# Patient Record
Sex: Female | Born: 1985 | Race: Black or African American | Hispanic: No | Marital: Single | Smoking: Never smoker
Health system: Southern US, Community
[De-identification: ages and names within clinical notes are randomized; demographics above are authoritative.]

## PROBLEM LIST (undated history)

## (undated) ENCOUNTER — Inpatient Hospital Stay (HOSPITAL_COMMUNITY): Payer: Self-pay

## (undated) DIAGNOSIS — O24419 Gestational diabetes mellitus in pregnancy, unspecified control: Secondary | ICD-10-CM

## (undated) DIAGNOSIS — R519 Headache, unspecified: Secondary | ICD-10-CM

## (undated) DIAGNOSIS — R51 Headache: Secondary | ICD-10-CM

## (undated) DIAGNOSIS — D649 Anemia, unspecified: Secondary | ICD-10-CM

## (undated) HISTORY — DX: Headache, unspecified: R51.9

## (undated) HISTORY — PX: WISDOM TOOTH EXTRACTION: SHX21

## (undated) HISTORY — PX: TONSILLECTOMY AND ADENOIDECTOMY: SHX28

## (undated) HISTORY — DX: Anemia, unspecified: D64.9

## (undated) HISTORY — DX: Headache: R51

## (undated) HISTORY — DX: Gestational diabetes mellitus in pregnancy, unspecified control: O24.419

---

## 2009-01-30 ENCOUNTER — Ambulatory Visit: Payer: Self-pay | Admitting: Gynecology

## 2009-02-11 ENCOUNTER — Emergency Department (HOSPITAL_COMMUNITY): Admission: EM | Admit: 2009-02-11 | Discharge: 2009-02-11 | Payer: Self-pay | Admitting: Emergency Medicine

## 2009-02-21 ENCOUNTER — Ambulatory Visit: Payer: Self-pay | Admitting: Women's Health

## 2009-12-24 ENCOUNTER — Ambulatory Visit: Payer: Self-pay | Admitting: Gynecology

## 2010-02-06 ENCOUNTER — Ambulatory Visit: Payer: Self-pay | Admitting: Gynecology

## 2010-02-06 ENCOUNTER — Other Ambulatory Visit: Admission: RE | Admit: 2010-02-06 | Discharge: 2010-02-06 | Payer: Self-pay | Admitting: Gynecology

## 2010-03-31 ENCOUNTER — Ambulatory Visit: Payer: Self-pay | Admitting: Women's Health

## 2010-04-18 ENCOUNTER — Ambulatory Visit: Payer: Self-pay | Admitting: Gynecology

## 2011-05-23 ENCOUNTER — Emergency Department (HOSPITAL_COMMUNITY)
Admission: EM | Admit: 2011-05-23 | Discharge: 2011-05-23 | Disposition: A | Discharge status: Home / Self Care | Payer: Managed Care, Other (non HMO) | Source: Home / Self Care | Attending: Family Medicine | Admitting: Family Medicine

## 2011-05-23 ENCOUNTER — Encounter: Payer: Self-pay | Admitting: Emergency Medicine

## 2011-05-23 ENCOUNTER — Emergency Department (INDEPENDENT_AMBULATORY_CARE_PROVIDER_SITE_OTHER): Payer: Managed Care, Other (non HMO)

## 2011-05-23 DIAGNOSIS — IMO0002 Reserved for concepts with insufficient information to code with codable children: Secondary | ICD-10-CM

## 2011-05-23 DIAGNOSIS — S39012A Strain of muscle, fascia and tendon of lower back, initial encounter: Secondary | ICD-10-CM

## 2011-05-23 MED ORDER — CYCLOBENZAPRINE HCL 5 MG PO TABS: 5.0000 mg | ORAL_TABLET | Freq: Three times a day (TID) | ORAL | Status: AC | PRN

## 2011-05-23 MED ORDER — HYDROCODONE-ACETAMINOPHEN 5-325 MG PO TABS: ORAL_TABLET | ORAL | Status: AC

## 2011-05-23 MED ORDER — NAPROXEN 500 MG PO TABS: 500.0000 mg | ORAL_TABLET | Freq: Two times a day (BID) | ORAL | Status: AC

## 2011-05-23 NOTE — ED Notes (Signed)
Pt here with sudden lower back pain that started 05/14/11 while bending over.pt states it felt like something tore in my lower back the radiated to right thigh with intermit numbness.pt went to [facility] and was told to take otc ibuprofen but states sx has worsened with burning pain and the whole back is hurting.no injury reported.

## 2011-05-23 NOTE — ED Provider Notes (Signed)
History     CSN: [id number]  Arrival date & time 05/23/11  1153   First MD Initiated Contact with Patient 05/23/11 1157      Chief Complaint  Patient presents with  . Back Pain    (Consider location/radiation/quality/duration/timing/severity/associated sxs/prior treatment) HPI Comments: Brandy Newton presents for evaluation of acute back pain that started on 12/27, after bending over to pick up a box. She felt immediate pain radiating across her back, with some radiation down her RIGHT leg.  Patient is a 26 y.o. female presenting with back pain. The history is provided by the patient.  Back Pain  This is a new problem. The current episode started more than 1 week ago. The problem occurs constantly. The problem has not changed since onset.The pain is associated with lifting heavy objects. The pain is present in the lumbar spine. The quality of the pain is described as stabbing and shooting. The pain radiates to the right thigh. The pain is moderate. The symptoms are aggravated by bending, twisting and certain positions. Pertinent negatives include no paresthesias, no paresis, no tingling and no weakness.    Past Medical History  Diagnosis Date  . Asthma     History reviewed. No pertinent past surgical history.  No family history on file.  History  Substance Use Topics  . Smoking status: Never Smoker   . Smokeless tobacco: Not on file  . Alcohol Use: Yes    OB History    Grav Para Term Preterm Abortions TAB SAB Ect Mult Living                  Review of Systems  Musculoskeletal: Positive for back pain.  Neurological: Negative for tingling, weakness and paresthesias.    Allergies  Review of patient's allergies indicates no known allergies.  Home Medications   Current Outpatient Rx  Name Route Sig Dispense Refill  . CYCLOBENZAPRINE HCL 5 MG PO TABS Oral Take 1 tablet (5 mg total) by mouth 3 (three) times daily as needed for muscle spasms (Take one tablet at night, and up  to three times daily if not too drowsy; may take two tablets at a time if tolerated). 15 tablet 0  . HYDROCODONE-ACETAMINOPHEN 5-325 MG PO TABS  Take one to two tablets every 4 to 6 hours as needed for pain 20 tablet 0  . NAPROXEN 500 MG PO TABS Oral Take 1 tablet (500 mg total) by mouth 2 (two) times daily. 30 tablet 0    BP 130/76  Pulse 80  Temp(Src) 98.2 F (36.8 C) (Oral)  Resp 20  SpO2 100%  LMP 05/23/2011  Physical Exam  Nursing note and vitals reviewed. Constitutional: She is oriented to person, place, and time. She appears well-developed and well-nourished.  HENT:  Head: Normocephalic and atraumatic.  Eyes: EOM are normal.  Neck: Normal range of motion.  Pulmonary/Chest: Effort normal.  Musculoskeletal: Normal range of motion.       Lumbar back: She exhibits tenderness, bony tenderness and pain.       Back:  Neurological: She is alert and oriented to person, place, and time.  Skin: Skin is warm and dry.  Psychiatric: Her behavior is normal.    ED Course  Procedures (including critical care time)  Labs Reviewed - No data to display Dg Lumbar Spine Complete  05/23/2011  *RADIOLOGY REPORT*  Clinical Data: Low back pain.  LUMBAR SPINE - COMPLETE 4+ VIEW  Comparison: None.  Findings: Five non-rib bearing lumbar vertebrae.  These  have normal appearances.  No pars defects or subluxations.  IMPRESSION: Normal examination.  Original Report Authenticated By: Darrol Angel, M.D.     1. Back strain       MDM  Back strain        Richardo Priest, MD 05/23/11 [phone]

## 2013-11-14 ENCOUNTER — Encounter: Payer: Self-pay | Admitting: Women's Health

## 2013-11-14 ENCOUNTER — Ambulatory Visit (INDEPENDENT_AMBULATORY_CARE_PROVIDER_SITE_OTHER): Payer: BC Managed Care – PPO | Admitting: Women's Health

## 2013-11-14 VITALS — BP 112/70 | Ht 64.5 in | Wt 179.0 lb

## 2013-11-14 DIAGNOSIS — J45990 Exercise induced bronchospasm: Secondary | ICD-10-CM

## 2013-11-14 DIAGNOSIS — Z01419 Encounter for gynecological examination (general) (routine) without abnormal findings: Secondary | ICD-10-CM

## 2013-11-14 DIAGNOSIS — Z833 Family history of diabetes mellitus: Secondary | ICD-10-CM

## 2013-11-14 DIAGNOSIS — Z3041 Encounter for surveillance of contraceptive pills: Secondary | ICD-10-CM

## 2013-11-14 LAB — CBC WITH DIFFERENTIAL/PLATELET
BASOS PCT: 0 % (ref 0–1)
Basophils Absolute: 0 10*3/uL (ref 0.0–0.1)
EOS ABS: 0.1 10*3/uL (ref 0.0–0.7)
Eosinophils Relative: 1 % (ref 0–5)
HCT: 40.9 % (ref 36.0–46.0)
HEMOGLOBIN: 13.5 g/dL (ref 12.0–15.0)
LYMPHS ABS: 3.3 10*3/uL (ref 0.7–4.0)
Lymphocytes Relative: 51 % — ABNORMAL HIGH (ref 12–46)
MCH: 28.7 pg (ref 26.0–34.0)
MCHC: 33 g/dL (ref 30.0–36.0)
MCV: 86.8 fL (ref 78.0–100.0)
MONOS PCT: 8 % (ref 3–12)
Monocytes Absolute: 0.5 10*3/uL (ref 0.1–1.0)
NEUTROS ABS: 2.6 10*3/uL (ref 1.7–7.7)
NEUTROS PCT: 40 % — AB (ref 43–77)
PLATELETS: 388 10*3/uL (ref 150–400)
RBC: 4.71 MIL/uL (ref 3.87–5.11)
RDW: 13.9 % (ref 11.5–15.5)
WBC: 6.4 10*3/uL (ref 4.0–10.5)

## 2013-11-14 LAB — GLUCOSE, RANDOM: GLUCOSE: 86 mg/dL (ref 70–99)

## 2013-11-14 LAB — TSH: TSH: 1.313 u[IU]/mL (ref 0.350–4.500)

## 2013-11-14 MED ORDER — ALBUTEROL SULFATE HFA 108 (90 BASE) MCG/ACT IN AERS: 2.0000 | INHALATION_SPRAY | Freq: Four times a day (QID) | RESPIRATORY_TRACT | Status: DC | PRN

## 2013-11-14 MED ORDER — LEVONORGESTREL-ETHINYL ESTRAD 0.1-20 MG-MCG PO TABS: 1.0000 | ORAL_TABLET | Freq: Every day | ORAL | Status: DC

## 2013-11-14 MED ORDER — FLUCONAZOLE 150 MG PO TABS: 150.0000 mg | ORAL_TABLET | Freq: Once | ORAL | Status: DC

## 2013-11-14 NOTE — Progress Notes (Signed)
Brandy Newton Genet Oct 19, 1985 [id number]    History:    Presents for annual exam.  Regular monthly cycle on lo Loestrin, same partner for for 4 years. Requesting birth control pill change, cost and skin changes.  Normal Pap history. Did not receive gardasil. Had normal Pap 2014.  Past medical history, past surgical history, family history and social history were all reviewed and documented in the EPIC chart. Graduated from [employer] in [employer], [place]. Uses an albuterol inhaler for exercise-induced asthma.  ROS:  A  12 point ROS was performed and pertinent positives and negatives are included.  Exam:  Filed Vitals:   11/14/13 0923  BP: 112/70    General appearance:  Normal Thyroid:  Symmetrical, normal in size, without palpable masses or nodularity. Respiratory  Auscultation:  Clear without wheezing or rhonchi Cardiovascular  Auscultation:  Regular rate, without rubs, murmurs or gallops  Edema/varicosities:  Not grossly evident Abdominal  Soft,nontender, without masses, guarding or rebound.  Liver/spleen:  No organomegaly noted  Hernia:  None appreciated  Skin  Inspection:  Grossly normal   Breasts: Examined lying and sitting.     Right: Without masses, retractions, discharge or axillary adenopathy.     Left: Without masses, retractions, discharge or axillary adenopathy. Gentitourinary   Inguinal/mons:  Normal without inguinal adenopathy  External genitalia:  Declined pelvic exam today, on cycle.  Assessment/Plan:  28 y.o. SBF G0 for annual exam.    Contraception management Headaches without aura Exercise-induced asthma  Plan: Albuterol inhaler, uses only occasionally with exercise, prescription, proper use reviewed, if increased problems reviewed would need to see internal medicine/primary care. Contraception options reviewed, will try Alesse, prescription, proper use, slight risk for blood clots and strokes reviewed. Finish out current pack and start. SBE's,  regular exercise, calcium rich diet, MVI daily encouraged. CBC, TSH, glucose, UA. Instructed to return to office in several weeks for a pelvic exam. Instructed to schedule appointment with Dr. Vela ProseLewitt to evaluate headaches.  Note: This dictation was prepared with Dragon/digital dictation.  Any transcriptional errors that result are unintentional. Harrington ChallengerYOUNG,NANCY J [facility] HospitalWHNP, 12:22 PM 11/14/2013

## 2013-11-14 NOTE — Patient Instructions (Signed)

## 2013-11-15 LAB — URINALYSIS W MICROSCOPIC + REFLEX CULTURE
Bacteria, UA: NONE SEEN
Bilirubin Urine: NEGATIVE
Casts: NONE SEEN
Crystals: NONE SEEN
GLUCOSE, UA: NEGATIVE mg/dL
HGB URINE DIPSTICK: NEGATIVE
Ketones, ur: NEGATIVE mg/dL
LEUKOCYTES UA: NEGATIVE
NITRITE: NEGATIVE
PH: 5.5 (ref 5.0–8.0)
PROTEIN: NEGATIVE mg/dL
SQUAMOUS EPITHELIAL / LPF: NONE SEEN
Specific Gravity, Urine: 1.008 (ref 1.005–1.030)
Urobilinogen, UA: 0.2 mg/dL (ref 0.0–1.0)

## 2014-02-01 ENCOUNTER — Encounter: Payer: Self-pay | Admitting: Women's Health

## 2014-02-01 ENCOUNTER — Ambulatory Visit (INDEPENDENT_AMBULATORY_CARE_PROVIDER_SITE_OTHER): Payer: BC Managed Care – PPO | Admitting: Women's Health

## 2014-02-01 DIAGNOSIS — Z01419 Encounter for gynecological examination (general) (routine) without abnormal findings: Secondary | ICD-10-CM

## 2014-02-01 DIAGNOSIS — N760 Acute vaginitis: Secondary | ICD-10-CM

## 2014-02-01 DIAGNOSIS — A499 Bacterial infection, unspecified: Secondary | ICD-10-CM

## 2014-02-01 DIAGNOSIS — B9689 Other specified bacterial agents as the cause of diseases classified elsewhere: Secondary | ICD-10-CM

## 2014-02-01 DIAGNOSIS — N898 Other specified noninflammatory disorders of vagina: Secondary | ICD-10-CM

## 2014-02-01 DIAGNOSIS — Z113 Encounter for screening for infections with a predominantly sexual mode of transmission: Secondary | ICD-10-CM

## 2014-02-01 LAB — WET PREP FOR TRICH, YEAST, CLUE
TRICH WET PREP: NONE SEEN
WBC WET PREP: NONE SEEN
Yeast Wet Prep HPF POC: NONE SEEN

## 2014-02-01 MED ORDER — METRONIDAZOLE 0.75 % VA GEL: VAGINAL | Status: DC

## 2014-02-01 MED ORDER — FLUCONAZOLE 150 MG PO TABS: 150.0000 mg | ORAL_TABLET | Freq: Once | ORAL | Status: DC

## 2014-02-01 NOTE — Patient Instructions (Signed)
Bacterial Vaginosis Bacterial vaginosis is an infection of the vagina. It happens when too many of certain germs (bacteria) grow in the vagina. HOME CARE  Take your medicine as told by your doctor.  Finish your medicine even if you start to feel better.  Do not have sex until you finish your medicine and are better.  Tell your sex partner that you have an infection. They should see their doctor for treatment.  Practice safe sex. Use condoms. Have only one sex partner. GET HELP IF:  You are not getting better after 3 days of treatment.  You have more grey fluid (discharge) coming from your vagina than before.  You have more pain than before.  You have a fever. MAKE SURE YOU:   Understand these instructions.  Will watch your condition.  Will get help right away if you are not doing well or get worse. Document Released: 02/11/2008 Document Revised: 02/22/2013 Document Reviewed: 12/14/2012 ExitCare Patient Information 2015 [employer]. This information is not intended to replace advice given to you by your health care provider. Make sure you discuss any questions you have with your health care provider.  

## 2014-02-01 NOTE — Progress Notes (Signed)
Patient ID: Brandy Newton, female   DOB: 1985/07/15, 28 y.o.   MRN: [id number] Presents with complaint of vaginal discharge with odor and mild itching. Recurrent bacteria vaginosis. Recently found out long-term partner unfaithful, no longer together. Denies urinary symptoms, abdominal pain or fever.  Exam: Appears well. External genitalia within normal limits, speculum exam moderate amount of a white adherent discharge noted wet prep positive for amines, clues, TNTC bacteria. GC/Chlamydia culture taken. Bimanual no CMT or adnexal fullness or tenderness.  Bacteria vaginosis STD screen  Plan: MetroGel vaginal cream 1 applicator at bedtime x5, Diflucan 150 by mouth x1 dose if vaginal itching does not resolve. GC/Chlamydia culture pending, HIV, hep B, C., RPR. Encouraged condoms if sexually active. Boric Acid gel caps 600 mg per vagina twice weekly after symptoms are resolved.

## 2014-02-02 LAB — GC/CHLAMYDIA PROBE AMP
CT Probe RNA: NEGATIVE
GC Probe RNA: NEGATIVE

## 2014-02-13 ENCOUNTER — Ambulatory Visit (INDEPENDENT_AMBULATORY_CARE_PROVIDER_SITE_OTHER): Payer: BC Managed Care – PPO | Admitting: Family Medicine

## 2014-02-13 VITALS — BP 122/82 | HR 78 | Temp 98.1°F | Resp 18 | Ht 66.0 in | Wt 171.6 lb

## 2014-02-13 DIAGNOSIS — G43111 Migraine with aura, intractable, with status migrainosus: Secondary | ICD-10-CM

## 2014-02-13 MED ORDER — OXYCODONE-ACETAMINOPHEN 7.5-325 MG PO TABS: 1.0000 | ORAL_TABLET | ORAL | Status: DC | PRN

## 2014-02-13 NOTE — Progress Notes (Signed)
This is a 28 year old woman who [place] for [place]. 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  Has long history of migraines and usually is able take Excedrin Migraine for good relief. Nevertheless, beginning on Saturday she developed a headache which just did not want go away. He describes the headache as pounding and frontal. He's had some nausea but no vomiting. She's had some scotoma as well.  Patient denies neck stiffness, fever, sore throat, ear pain. Patient has had no focal weakness or numbness. He has had some significant dizziness however.  She was like to see a neurologist.  Objective: No acute distress Neck is supple with no adenopathy or thyromegaly HEENT: Unremarkable with equal pupils. Respirations are normal Neurological: Moving 4 extremities equally. Patient is stable on her feet. Cranial nerves III through XII are intact  Assessment: Status migrainous  Intractable migraine with aura with status migrainosus - Plan: oxyCODONE-acetaminophen (PERCOCET) 7.5-325 MG per tablet, Ambulatory referral to Neurology  Signed, Elvina SidleKurt Rut Betterton, MD

## 2014-02-13 NOTE — Patient Instructions (Signed)

## 2014-03-12 ENCOUNTER — Telehealth: Payer: Self-pay | Admitting: *Deleted

## 2014-03-12 NOTE — Telephone Encounter (Signed)
(   pt aware you are out of the office) Pt called requesting Rx for recurrent yeast infection, pt aware OV best, states she has spoken to you before about recurrent yeast. Please advise

## 2014-03-12 NOTE — Telephone Encounter (Signed)
OV best, diflucan 150 on dose if no relief OV.

## 2014-03-13 MED ORDER — FLUCONAZOLE 150 MG PO TABS: 150.0000 mg | ORAL_TABLET | Freq: Once | ORAL | Status: DC

## 2014-03-13 NOTE — Telephone Encounter (Signed)
Pt informed with the below note, rx sent. 

## 2014-03-23 ENCOUNTER — Encounter: Payer: Self-pay | Admitting: Neurology

## 2014-03-23 ENCOUNTER — Ambulatory Visit (INDEPENDENT_AMBULATORY_CARE_PROVIDER_SITE_OTHER): Payer: BC Managed Care – PPO | Admitting: Neurology

## 2014-03-23 VITALS — BP 122/78 | HR 80 | Temp 98.6°F | Resp 16 | Ht 64.0 in | Wt 173.8 lb

## 2014-03-23 DIAGNOSIS — G43119 Migraine with aura, intractable, without status migrainosus: Secondary | ICD-10-CM

## 2014-03-23 DIAGNOSIS — H539 Unspecified visual disturbance: Secondary | ICD-10-CM

## 2014-03-23 DIAGNOSIS — IMO0002 Reserved for concepts with insufficient information to code with codable children: Secondary | ICD-10-CM

## 2014-03-23 DIAGNOSIS — R202 Paresthesia of skin: Secondary | ICD-10-CM

## 2014-03-23 DIAGNOSIS — G43709 Chronic migraine without aura, not intractable, without status migrainosus: Secondary | ICD-10-CM

## 2014-03-23 DIAGNOSIS — Z820 Family history of epilepsy and other diseases of the nervous system: Secondary | ICD-10-CM

## 2014-03-23 MED ORDER — TOPIRAMATE 25 MG PO TABS: 25.0000 mg | ORAL_TABLET | Freq: Every day | ORAL | Status: DC

## 2014-03-23 MED ORDER — SUMATRIPTAN SUCCINATE 100 MG PO TABS: ORAL_TABLET | ORAL | Status: DC

## 2014-03-23 NOTE — Patient Instructions (Addendum)
1.  Take sumatriptan 100mg  at earliest onset of migraine.  May repeat once in 2 hours if headache recurs or persists.  Do not exceed 2 tablets in 24 hours. 2.  Take topiramate 25mg  daily at bedtime.  Possible side effects include: impaired thinking, sedation, paresthesias (numbness and tingling) and weight loss.  It may cause dehydration and there is a small risk for kidney stones, so make sure to stay hydrated with water during the day.  There is also a very small risk for glaucoma, so if you notice any change in your vision while taking this medication, see an ophthalmologist.  There is also a very small risk of possible suicidal ideation, as it the case with all antiepileptic medications.e  3.  Limit use of pain relievers (including sumatriptan) to no more than 2 days out of the week to prevent rebound headache 4.  Will get MRI of the brain with and without contrast [facility]  11/23/15at 4:45pm 5.  We will refer you to psychiatry to address the nightmares because proper sleep is important to treat headaches. 6.  Follow up in 3 months but call in 4 weeks with update.  Keep a headache diary listing the number of migraines and other headaches per day to see how you are doing

## 2014-03-23 NOTE — Progress Notes (Signed)
NEUROLOGY CONSULTATION NOTE  Brandy Hawiesha Schalk MRN: [id number] DOB: 07-10-1985  Referring provider: Dr. Milus GlazierLauenstein Primary care provider: NA  Reason for consult:  Headache  HISTORY OF PRESENT ILLNESS: Brandy Newton is a 28 year old right-handed woman with history of asthma and mgraine who presents for headache.  Onset:  28 years old Location:  Unilateral, either side, but can spread across forehead Quality:  Throbbing and squeezing Intensity:  6-9/10  Aura:  Scotomas and blurred vision in either eye for 10 minutes Prodrome:  no Associated symptoms:  Dizziness, photophobia, phonophobia Duration:  2-4 hours.   Frequency:  2-3 times a week (8-12 migraine days per month).  However, she also has nagging headache, so total of 18 headache days per month. Triggers/exacerbating factors:  Stress, NovaRing Relieving factors:  Dark quiet room Activity:  Cannot function about 1-2 days per month.  Past abortive therapy:  Excedrin and Excedrin Migraine (initially helpful), Advil, Aleve, Tylenol Past preventative therapy:  Does not remember  Current abortive therapy:  Percocet (took only 4 in past month).  BC powder rarely Current preventative therapy:  none  Caffeine:  Coffee twice a week.  No soda or tea Alcohol:  no Smoker:  no Diet:  Healthy.  Lost 10 lbs.  Drinks plenty of water Exercise:  regularly Depression/stress:  Some stress related to work.  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  She has frequent nightmares that cause stress and prevents her from falling back to sleep. Family history of headache:  No Family neurologic history:  Mom has MS.  No family history of intracranial aneurysms.  About 4 weeks ago, she had an episode of migraine that was accompanied by new symptoms.  She noted numbness and tingling in her hands that lasted all day.  PAST MEDICAL HISTORY: Past Medical History  Diagnosis Date   . Asthma   . Asthma   . Anemia   . Headache     PAST SURGICAL HISTORY: Past Surgical History  Procedure Laterality Date  . Tonsillectomy and adenoidectomy      MEDICATIONS: Current Outpatient Prescriptions on File Prior to Visit  Medication Sig Dispense Refill  . albuterol (PROVENTIL HFA;VENTOLIN HFA) 108 (90 BASE) MCG/ACT inhaler Inhale 2 puffs into the lungs every 6 (six) hours as needed for wheezing or shortness of breath. 1 Inhaler 2  . fluconazole (DIFLUCAN) 150 MG tablet Take 1 tablet (150 mg total) by mouth once. 1 tablet 0  . metroNIDAZOLE (METROGEL VAGINAL) 0.75 % vaginal gel 1 applicator per vagina at HS x 5 70 g 1  . oxyCODONE-acetaminophen (PERCOCET) 7.5-325 MG per tablet Take 1 tablet by mouth every 4 (four) hours as needed for pain. 20 tablet 0   No current facility-administered medications on file prior to visit.    ALLERGIES: No Known Allergies  FAMILY HISTORY: Family History  Problem Relation Age of Onset  . Hypertension Father   . Hypertension Paternal Grandmother   . Diabetes Paternal Grandmother   . Hypertension Paternal Grandfather   . Diabetes Paternal Grandfather   . Diabetes Maternal Grandmother   . Diabetes Maternal Grandfather   . Multiple sclerosis Mother     SOCIAL HISTORY: History   Social History  . Marital Status: Single    Spouse Name: N/A    Number of Children: N/A  . Years of Education: N/A   Occupational History  . Not on file.   Social  History Main Topics  . Smoking status: Never Smoker   . Smokeless tobacco: Not on file  . Alcohol Use: No     Comment: SOCIAL  . Drug Use: No  . Sexual Activity:    Partners: Male    Birth Control/ Protection: Condom   Other Topics Concern  . Not on file   Social History Narrative    REVIEW OF SYSTEMS: Constitutional: No fevers, chills, or sweats, no generalized fatigue, change in appetite Eyes: No visual changes, double vision, eye pain Ear, nose and throat: No hearing loss,  ear pain, nasal congestion, sore throat Cardiovascular: No chest pain, palpitations Respiratory:  No shortness of breath at rest or with exertion, wheezes GastrointestinaI: No nausea, vomiting, diarrhea, abdominal pain, fecal incontinence Genitourinary:  No dysuria, urinary retention or frequency Musculoskeletal:  No neck pain, back pain Integumentary: No rash, pruritus, skin lesions Neurological: as above Psychiatric: No depression, insomnia, anxiety Endocrine: No palpitations, fatigue, diaphoresis, mood swings, change in appetite, change in weight, increased thirst Hematologic/Lymphatic:  No anemia, purpura, petechiae. Allergic/Immunologic: no itchy/runny eyes, nasal congestion, recent allergic reactions, rashes  PHYSICAL EXAM: Filed Vitals:   03/23/14 0802  BP: 122/78  Pulse: 80  Temp: 98.6 F (37 C)  Resp: 16   General: No acute distress Head:  Normocephalic/atraumatic Neck: supple, no paraspinal tenderness, full range of motion Back: No paraspinal tenderness Heart: regular rate and rhythm Lungs: Clear to auscultation bilaterally. Vascular: No carotid bruits. Neurological Exam: Mental status: alert and oriented to person, place, and time, recent and remote memory intact, fund of knowledge intact, attention and concentration intact, speech fluent and not dysarthric, language intact. Cranial nerves: CN I: not tested CN II: pupils equal, round and reactive to light, visual fields intact, fundi unremarkable, without vessel changes, exudates, hemorrhages or papilledema. CN III, IV, VI:  full range of motion, no nystagmus, no ptosis CN V: facial sensation intact CN VII: upper and lower face symmetric CN VIII: hearing intact CN IX, X: gag intact, uvula midline CN XI: sternocleidomastoid and trapezius muscles intact CN XII: tongue midline Bulk & Tone: normal, no fasciculations. Motor:  5/5 throughout Sensation: temperature and vibration intact Deep Tendon Reflexes: 2+  throughout, toes downgoing Finger to nose testing:  No dysmetria Heel to shin: no dysmetria Gait:  Normal station and stride.  Able to turn and walk in tandem. Romberg negative.  IMPRESSION: Chronic migraine with aura Visual disturbance Episode of numbness and tingling involving the hands Family history of MS  PLAN: 1.  Topamax 25mg  daily for preventative therapy.  Discussed side effects. 2.  Sumatriptan 100mg  for abortive therapy.  Stop Percocet. 3.  Limit use of pain relievers to no more than 2 days out of the week to prevent rebound headache 4.  Episode of numbness and tingling and visual disturbance likely migraine.  But since numbness and tingling is a new symptoms, and she has a family history of MS, will get MRI of the brain with and without  Contrast. 5.  Sleep hygiene is very important to treat headaches.  We will refer her to psychiatry to address frequent nightmares. 6.  Headache diary 7.  Follow up in 3 months.  Call in 4 weeks with update. Thank you for allowing me to take part in the care of this patient.  Shon MilletAdam Jaffe, DO  CC:  Elvina SidleKurt Lauenstein, MD

## 2014-04-05 ENCOUNTER — Telehealth: Payer: Self-pay | Admitting: Neurology

## 2014-04-05 NOTE — Telephone Encounter (Signed)
See below note this patient called [facility] and cancelled MRI Brain does not want to have it at all . I tried to contact patient however it appears her telephone has been disconnected .

## 2014-04-05 NOTE — Telephone Encounter (Signed)
Tasha from [facility] called wanting to speak to DR. Jaffe's nurse. Pt canceled their MRI appt and she would like to know what she needs to do with the MRI order. Please call her to confirm. C/b [phone]

## 2014-04-06 ENCOUNTER — Ambulatory Visit (INDEPENDENT_AMBULATORY_CARE_PROVIDER_SITE_OTHER): Payer: BC Managed Care – PPO | Admitting: Women's Health

## 2014-04-06 ENCOUNTER — Encounter: Payer: Self-pay | Admitting: Women's Health

## 2014-04-06 VITALS — BP 122/80 | Ht 64.0 in | Wt 171.0 lb

## 2014-04-06 DIAGNOSIS — A499 Bacterial infection, unspecified: Secondary | ICD-10-CM

## 2014-04-06 DIAGNOSIS — N898 Other specified noninflammatory disorders of vagina: Secondary | ICD-10-CM

## 2014-04-06 DIAGNOSIS — N760 Acute vaginitis: Secondary | ICD-10-CM

## 2014-04-06 DIAGNOSIS — B9689 Other specified bacterial agents as the cause of diseases classified elsewhere: Secondary | ICD-10-CM

## 2014-04-06 LAB — URINALYSIS W MICROSCOPIC + REFLEX CULTURE
BILIRUBIN URINE: NEGATIVE
GLUCOSE, UA: NEGATIVE mg/dL
HGB URINE DIPSTICK: NEGATIVE
KETONES UR: NEGATIVE mg/dL
LEUKOCYTES UA: NEGATIVE
Nitrite: NEGATIVE
PH: 8.5 — AB (ref 5.0–8.0)
PROTEIN: NEGATIVE mg/dL
Specific Gravity, Urine: 1.015 (ref 1.005–1.030)
Urobilinogen, UA: 0.2 mg/dL (ref 0.0–1.0)

## 2014-04-06 LAB — WET PREP FOR TRICH, YEAST, CLUE
Trich, Wet Prep: NONE SEEN
WBC WET PREP: NONE SEEN
Yeast Wet Prep HPF POC: NONE SEEN

## 2014-04-06 MED ORDER — METRONIDAZOLE 500 MG PO TABS: 500.0000 mg | ORAL_TABLET | Freq: Two times a day (BID) | ORAL | Status: DC

## 2014-04-06 NOTE — Progress Notes (Signed)
Patient ID: Brandy Newton, female   DOB: Aug 14, 1985, 28 y.o.   MRN: [id number] Presents with complaint of vaginal discharge with odor for several days. Reports getting either bacteria or yeast most months. Regular monthly cycle/same partner. Denies abdominal pain, fever, or urinary symptoms. Currently being treated for migraines has started on a regimen of Topamax and imipramine and has follow-up scheduled.  Exam: Appears well. External genitalia within normal limits, speculum exam scant watery discharge noted, wet prep positive for amines, clues, TNTC bacteria. Bimanual no CMT or adnexal tenderness UA: Negative.  Recurrent Bacteria vaginosis  Plan: Flagyl 500 twice daily for 7 days #14, alcohol precautions reviewed. Will try boric acid gelcaps vaginally twice weekly for prevention in 2 weeks, will call if no relief of symptoms or recurrence.

## 2014-04-06 NOTE — Patient Instructions (Signed)
Bacterial Vaginosis Bacterial vaginosis is an infection of the vagina. It happens when too many of certain germs (bacteria) grow in the vagina. HOME CARE  Take your medicine as told by your doctor.  Finish your medicine even if you start to feel better.  Do not have sex until you finish your medicine and are better.  Tell your sex partner that you have an infection. They should see their doctor for treatment.  Practice safe sex. Use condoms. Have only one sex partner. GET HELP IF:  You are not getting better after 3 days of treatment.  You have more grey fluid (discharge) coming from your vagina than before.  You have more pain than before.  You have a fever. MAKE SURE YOU:   Understand these instructions.  Will watch your condition.  Will get help right away if you are not doing well or get worse. Document Released: 02/11/2008 Document Revised: 02/22/2013 Document Reviewed: 12/14/2012 ExitCare Patient Information 2015 [employer]. This information is not intended to replace advice given to you by your health care provider. Make sure you discuss any questions you have with your health care provider.  

## 2014-04-09 ENCOUNTER — Ambulatory Visit (HOSPITAL_COMMUNITY): Admission: RE | Admit: 2014-04-09 | Payer: BC Managed Care – PPO | Source: Ambulatory Visit

## 2014-05-19 ENCOUNTER — Other Ambulatory Visit: Payer: Self-pay | Admitting: Neurology

## 2014-06-06 ENCOUNTER — Encounter: Payer: Self-pay | Admitting: Women's Health

## 2014-06-06 ENCOUNTER — Ambulatory Visit (INDEPENDENT_AMBULATORY_CARE_PROVIDER_SITE_OTHER): Payer: BLUE CROSS/BLUE SHIELD | Admitting: Women's Health

## 2014-06-06 VITALS — BP 110/70 | Ht 64.0 in | Wt 171.0 lb

## 2014-06-06 DIAGNOSIS — B3731 Acute candidiasis of vulva and vagina: Secondary | ICD-10-CM

## 2014-06-06 DIAGNOSIS — B373 Candidiasis of vulva and vagina: Secondary | ICD-10-CM

## 2014-06-06 LAB — WET PREP FOR TRICH, YEAST, CLUE
CLUE CELLS WET PREP: NONE SEEN
Trich, Wet Prep: NONE SEEN

## 2014-06-06 MED ORDER — FLUCONAZOLE 150 MG PO TABS: 150.0000 mg | ORAL_TABLET | Freq: Once | ORAL | Status: DC

## 2014-06-06 NOTE — Addendum Note (Signed)
Addended by: Kem ParkinsonBARNES, Dimitrious Micciche on: 06/06/2014 11:13 AM   Modules accepted: Orders

## 2014-06-06 NOTE — Patient Instructions (Signed)

## 2014-06-06 NOTE — Progress Notes (Signed)
Presents with vaginal and low abdominal pain x 1 week. Took boric acid vaginally as prescribed 1/9, had white clumpy discharge for 2 days and used diflucan twice. Now complains of persistent vaginal and abdominal pain. Denies urinary symptoms, dyspareunia, fever, back pain, odor, itching. Same partner/inconsistent condoms, stopped contraceptive pill in August. Pregnancy okay. Monthly  cycles.    Exam: Well appearing. External genitalia non erythematous. Speculum, non erythematous, healthy cervix, vaginal walls without visible lesion, minimal discharge, vaginal wall tenderness with q tip. Bimanual, no CMT, no adnexal tenderness.  Wet prep: rare yeast  Yeast vaginitis   Plan: Diflucan 150mg  x1, 1 refill. Yeast prevention reviewed. Call if symptoms persist or worsen. Discontinue boric acid as prophylaxis. UA pending. Contraception reviewed, declines.

## 2014-06-07 LAB — URINALYSIS W MICROSCOPIC + REFLEX CULTURE
Bacteria, UA: NONE SEEN
Bilirubin Urine: NEGATIVE
CASTS: NONE SEEN
CRYSTALS: NONE SEEN
Glucose, UA: NEGATIVE mg/dL
Hgb urine dipstick: NEGATIVE
KETONES UR: NEGATIVE mg/dL
LEUKOCYTES UA: NEGATIVE
NITRITE: NEGATIVE
PH: 5.5 (ref 5.0–8.0)
Protein, ur: NEGATIVE mg/dL
SPECIFIC GRAVITY, URINE: 1.02 (ref 1.005–1.030)
UROBILINOGEN UA: 0.2 mg/dL (ref 0.0–1.0)

## 2014-06-15 ENCOUNTER — Other Ambulatory Visit: Payer: Self-pay | Admitting: Neurology

## 2014-06-25 ENCOUNTER — Ambulatory Visit: Payer: BC Managed Care – PPO | Admitting: Neurology

## 2014-06-27 ENCOUNTER — Telehealth: Payer: Self-pay | Admitting: Neurology

## 2014-06-27 NOTE — Telephone Encounter (Signed)
Pt appt for 06/25/14 marked as no show as pt did not allow 24hrs to r/s appt. A no show letter will not be sent. Pt has appt next week / Brandy S.

## 2014-07-02 ENCOUNTER — Other Ambulatory Visit: Payer: Self-pay | Admitting: Neurology

## 2014-07-04 ENCOUNTER — Encounter: Payer: Self-pay | Admitting: Neurology

## 2014-07-04 ENCOUNTER — Ambulatory Visit (INDEPENDENT_AMBULATORY_CARE_PROVIDER_SITE_OTHER): Payer: BLUE CROSS/BLUE SHIELD | Admitting: Neurology

## 2014-07-04 VITALS — BP 118/66 | HR 68 | Temp 98.0°F | Resp 18 | Ht 64.0 in | Wt 174.7 lb

## 2014-07-04 DIAGNOSIS — G43709 Chronic migraine without aura, not intractable, without status migrainosus: Secondary | ICD-10-CM

## 2014-07-04 DIAGNOSIS — Z820 Family history of epilepsy and other diseases of the nervous system: Secondary | ICD-10-CM

## 2014-07-04 DIAGNOSIS — R202 Paresthesia of skin: Secondary | ICD-10-CM

## 2014-07-04 MED ORDER — TOPIRAMATE 25 MG PO TABS: 50.0000 mg | ORAL_TABLET | Freq: Every day | ORAL | Status: DC

## 2014-07-04 NOTE — Patient Instructions (Signed)
1.  We will increase topamax 25mg  to two tablets (50mg ) at bedtime.  Call in 4 weeks with update and we can increase dose further if needed. 2.  When you get a migraine, try one of two options:  A) Take sumatriptan 100mg  along with naproxen 500mg  at earliest onset of headache.  Repeat sumatriptan alone once in 2 hours if needed.  B) I will provide you samples of Zomig 5mg  nasal spray.  Spray once in one nostril at earliest onset of headache.  May repeat spray once in 2 hours if needed. 3.  We will get MRI of brain with and without contrast 4.  Follow up in 3 months.

## 2014-07-04 NOTE — Progress Notes (Signed)
NEUROLOGY FOLLOW UP OFFICE NOTE  Brandy Newton [id number]  HISTORY OF PRESENT ILLNESS: Brandy Newton is a 29 year old right-handed woman with history of asthma and mgraine who follows up for migraine.  UPDATE: She cancelled her MRI appointment but forgot to reschedule.  There has been no significant change in her migraines, however she never called with update so topamax was never increased.  She still notes numbness and tingling in hands. Intensity:  6-10/10 Duration:  2 hours if sumatriptan works (otherwise all day) Frequency:  15-17 headache days per month  Current abortive therapy:  sumatriptan 100mg   (sometimes effective but also causes cognitive clouding and lethargy) Current preventative therapy:  Topamax 25mg   Caffeine:  Coffee twice a week.  No soda or tea Alcohol:  no Smoker:  no Diet:  Healthy.  Lost 10 lbs.  Drinks plenty of water Exercise:  regularly Depression/stress:  Some stress related to work.  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  She has frequent nightmares that cause stress and prevents her from falling back to sleep.  HISTORY: Onset:  29 years old Location:  Unilateral, either side, but can spread across forehead Quality:  Throbbing and squeezing Initial Intensity:  6-9/10  Aura:  Scotomas and blurred vision in either eye for 10 minutes Prodrome:  no Associated symptoms:  Dizziness, photophobia, phonophobia Initial Duration:  2-4 hours.   Initial Frequency:  2-3 times a week (8-12 migraine days per month).  However, she also has nagging headache, so total of 18 headache days per month. Triggers/exacerbating factors:  Stress, NovaRing Relieving factors:  Dark quiet room Activity:  Cannot function about 1-2 days per month.  Past abortive therapy:  Excedrin and Excedrin Migraine (initially helpful), Advil, Aleve, Tylenol, Percocet.  BC powder rarely Past preventative therapy:   Does not remember  Family history of headache:  No Family neurologic history:  Mom has MS.  No family history of intracranial aneurysms.  In October, she had an episode of migraine that was accompanied by new symptoms.  She noted numbness and tingling in her hands that lasted all day.  PAST MEDICAL HISTORY: Past Medical History  Diagnosis Date  . Asthma   . Asthma   . Anemia   . Headache     MEDICATIONS: Current Outpatient Prescriptions on File Prior to Visit  Medication Sig Dispense Refill  . albuterol (PROVENTIL HFA;VENTOLIN HFA) 108 (90 BASE) MCG/ACT inhaler Inhale 2 puffs into the lungs every 6 (six) hours as needed for wheezing or shortness of breath. 1 Inhaler 2  . fluconazole (DIFLUCAN) 150 MG tablet Take 1 tablet (150 mg total) by mouth once. 1 tablet 1  . metroNIDAZOLE (METROGEL VAGINAL) 0.75 % vaginal gel 1 applicator per vagina at HS x 5 70 g 1  . SUMAtriptan (IMITREX) 100 MG tablet TAKE 1 TABLET BY MOUTH AT THE EARLIEST ONSET OF A HEADACHE,MAY REPEAT ONCE IN 2 HOURS IF PERSISTS 9 tablet 0  . oxyCODONE-acetaminophen (PERCOCET) 7.5-325 MG per tablet Take 1 tablet by mouth every 4 (four) hours as needed for pain. (Patient not taking: Reported on 07/04/2014) 20 tablet 0   No current facility-administered medications on file prior to visit.    ALLERGIES: No Known Allergies  FAMILY HISTORY: Family History  Problem Relation Age of Onset  . Hypertension Father   . Hypertension Paternal Grandmother   . Diabetes Paternal Grandmother   . Hypertension Paternal Grandfather   .  Diabetes Paternal Grandfather   . Diabetes Maternal Grandmother   . Diabetes Maternal Grandfather   . Multiple sclerosis Mother     SOCIAL HISTORY: History   Social History  . Marital Status: Single    Spouse Name: N/A  . Number of Children: N/A  . Years of Education: N/A   Occupational History  . Not on file.   Social History Main Topics  . Smoking status: Never Smoker   . Smokeless  tobacco: Not on file  . Alcohol Use: No     Comment: SOCIAL  . Drug Use: No  . Sexual Activity:    Partners: Male    Birth Control/ Protection: Condom   Other Topics Concern  . Not on file   Social History Narrative    REVIEW OF SYSTEMS: Constitutional: No fevers, chills, or sweats, no generalized fatigue, change in appetite Eyes: No visual changes, double vision, eye pain Ear, nose and throat: No hearing loss, ear pain, nasal congestion, sore throat Cardiovascular: No chest pain, palpitations Respiratory:  No shortness of breath at rest or with exertion, wheezes GastrointestinaI: No nausea, vomiting, diarrhea, abdominal pain, fecal incontinence Genitourinary:  No dysuria, urinary retention or frequency Musculoskeletal:  No neck pain, back pain Integumentary: No rash, pruritus, skin lesions Neurological: as above Psychiatric: No depression, insomnia, anxiety Endocrine: No palpitations, fatigue, diaphoresis, mood swings, change in appetite, change in weight, increased thirst Hematologic/Lymphatic:  No anemia, purpura, petechiae. Allergic/Immunologic: no itchy/runny eyes, nasal congestion, recent allergic reactions, rashes  PHYSICAL EXAM: Filed Vitals:   07/04/14 1451  BP: 118/66  Pulse: 68  Temp: 98 F (36.7 C)  Resp: 18   General: No acute distress Head:  Normocephalic/atraumatic Eyes:  Fundoscopic exam unremarkable without vessel changes, exudates, hemorrhages or papilledema. Neck: supple, no paraspinal tenderness, full range of motion Heart:  Regular rate and rhythm Lungs:  Clear to auscultation bilaterally Back: No paraspinal tenderness Neurological Exam: alert and oriented to person, place, and time. Attention span and concentration intact, recent and remote memory intact, fund of knowledge intact.  Speech fluent and not dysarthric, language intact.  CN II-XII intact. Fundoscopic exam unremarkable without vessel changes, exudates, hemorrhages or papilledema.  Bulk  and tone normal, muscle strength 5/5 throughout.  Deep tendon reflexes 2+ throughout.  Finger to nose testing intact.  Gait normal  IMPRESSION: Chronic migraine without aura Paresthesias Family history of MS  PLAN: 1.  Increase topamax to  at bedtime.  She is instructed to call in 4 weeks with update and we can adjust dose if needed. 2.  For abortive therapy, she is instructed to try two options:  1) sumatriptan plus naproxen ; or 2) Zomig  nasal spray (samples provided) 3.  She will get MRI of brain with and without contrast to investigate the new symptoms of paresthesias, especially given family history of MS 4.  Follow up in 3 months.  Shon Millet, DO

## 2014-07-05 ENCOUNTER — Other Ambulatory Visit: Payer: Self-pay | Admitting: *Deleted

## 2014-07-05 MED ORDER — SUMATRIPTAN SUCCINATE 100 MG PO TABS: 100.0000 mg | ORAL_TABLET | Freq: Once | ORAL | Status: DC

## 2014-07-18 ENCOUNTER — Ambulatory Visit: Payer: Self-pay | Admitting: Neurology

## 2014-07-19 ENCOUNTER — Encounter (INDEPENDENT_AMBULATORY_CARE_PROVIDER_SITE_OTHER): Payer: Self-pay

## 2014-07-19 ENCOUNTER — Ambulatory Visit (INDEPENDENT_AMBULATORY_CARE_PROVIDER_SITE_OTHER): Payer: BLUE CROSS/BLUE SHIELD | Admitting: Women's Health

## 2014-07-19 ENCOUNTER — Encounter: Payer: Self-pay | Admitting: Women's Health

## 2014-07-19 ENCOUNTER — Ambulatory Visit (HOSPITAL_COMMUNITY)
Admission: RE | Admit: 2014-07-19 | Discharge: 2014-07-19 | Disposition: A | Discharge status: Home / Self Care | Payer: BLUE CROSS/BLUE SHIELD | Source: Ambulatory Visit | Attending: Neurology | Admitting: Neurology

## 2014-07-19 VITALS — BP 120/78 | Ht 64.0 in | Wt 174.0 lb

## 2014-07-19 DIAGNOSIS — B373 Candidiasis of vulva and vagina: Secondary | ICD-10-CM | POA: Diagnosis not present

## 2014-07-19 DIAGNOSIS — R202 Paresthesia of skin: Secondary | ICD-10-CM

## 2014-07-19 DIAGNOSIS — R350 Frequency of micturition: Secondary | ICD-10-CM

## 2014-07-19 DIAGNOSIS — G43909 Migraine, unspecified, not intractable, without status migrainosus: Secondary | ICD-10-CM | POA: Insufficient documentation

## 2014-07-19 DIAGNOSIS — Z820 Family history of epilepsy and other diseases of the nervous system: Secondary | ICD-10-CM

## 2014-07-19 DIAGNOSIS — B3731 Acute candidiasis of vulva and vagina: Secondary | ICD-10-CM

## 2014-07-19 DIAGNOSIS — R209 Unspecified disturbances of skin sensation: Secondary | ICD-10-CM | POA: Insufficient documentation

## 2014-07-19 DIAGNOSIS — N898 Other specified noninflammatory disorders of vagina: Secondary | ICD-10-CM

## 2014-07-19 DIAGNOSIS — G43709 Chronic migraine without aura, not intractable, without status migrainosus: Secondary | ICD-10-CM

## 2014-07-19 LAB — URINALYSIS W MICROSCOPIC + REFLEX CULTURE
BILIRUBIN URINE: NEGATIVE
Glucose, UA: NEGATIVE mg/dL
Ketones, ur: NEGATIVE mg/dL
Leukocytes, UA: NEGATIVE
Nitrite: NEGATIVE
Specific Gravity, Urine: 1.015 (ref 1.005–1.030)
UROBILINOGEN UA: 2 mg/dL — AB (ref 0.0–1.0)
pH: 7 (ref 5.0–8.0)

## 2014-07-19 LAB — WET PREP FOR TRICH, YEAST, CLUE
CLUE CELLS WET PREP: NONE SEEN
TRICH WET PREP: NONE SEEN

## 2014-07-19 MED ORDER — FLUCONAZOLE 100 MG PO TABS: ORAL_TABLET | ORAL | Status: DC

## 2014-07-19 MED ORDER — GADOBENATE DIMEGLUMINE 529 MG/ML IV SOLN
20.0000 mL | Freq: Once | INTRAVENOUS | Status: AC
  Administered 2014-07-19: 16 mL via INTRAVENOUS

## 2014-07-19 NOTE — Patient Instructions (Signed)

## 2014-07-19 NOTE — Progress Notes (Signed)
Patient ID: Brandy Newton, female   DOB: 1986-04-24, 29 y.o.   MRN: [id number] Presents with complaint of vaginal discharge, irritation and itching. Mild urinary frequency without pain or burning. Denies abdominal pain or fever. Has had problems with recurrent BV and yeast. Had tried boric acid gelcaps with poor results. Same partner. Monthly cycle on Loestrin.  Exam: Appears well. External genitalia erythematous, speculum exam moderate amount of a white curdy discharge noted wet prep positive for yeast. Bimanual no CMT or adnexal tenderness. UA: Trace protein negative leukocytes.  Yeast vaginitis  Plan: Diflucan 200 mg by mouth today repeat in 3 days if needed, 100 mg weekly when necessary. Call if no relief. Yeast prevention discussed.

## 2014-07-20 ENCOUNTER — Telehealth: Payer: Self-pay | Admitting: *Deleted

## 2014-07-20 NOTE — Telephone Encounter (Signed)
-----   Message from Cira ServantAdam Robert Jaffe, DO sent at 07/20/2014  6:39 AM EST ----- MRI of brain looks okay.  No evidence of MS ----- Message -----    From: Rad Results In Interface    Sent: 07/19/2014   6:56 PM      To: Cira ServantAdam Robert Jaffe, DO

## 2014-07-20 NOTE — Telephone Encounter (Signed)
Patient is aware that MRI is normal no evidence of MS

## 2014-08-02 ENCOUNTER — Telehealth: Payer: Self-pay | Admitting: Neurology

## 2014-08-02 NOTE — Telephone Encounter (Signed)
Pt was given the name of an OTC med that she can take but she has forgotten the name of it. She believes it started with a "P". Please call patient at [phone] / Oneita KrasSherri S.

## 2014-08-02 NOTE — Telephone Encounter (Signed)
Spoke with patient medication was naproxan

## 2014-08-30 ENCOUNTER — Encounter: Payer: Self-pay | Admitting: Women's Health

## 2014-08-30 ENCOUNTER — Telehealth: Payer: Self-pay

## 2014-08-30 ENCOUNTER — Ambulatory Visit (INDEPENDENT_AMBULATORY_CARE_PROVIDER_SITE_OTHER): Payer: BLUE CROSS/BLUE SHIELD | Admitting: Women's Health

## 2014-08-30 DIAGNOSIS — Z3041 Encounter for surveillance of contraceptive pills: Secondary | ICD-10-CM | POA: Diagnosis not present

## 2014-08-30 DIAGNOSIS — N898 Other specified noninflammatory disorders of vagina: Secondary | ICD-10-CM | POA: Diagnosis not present

## 2014-08-30 DIAGNOSIS — N926 Irregular menstruation, unspecified: Secondary | ICD-10-CM

## 2014-08-30 LAB — WET PREP FOR TRICH, YEAST, CLUE
Clue Cells Wet Prep HPF POC: NONE SEEN
TRICH WET PREP: NONE SEEN
WBC WET PREP: NONE SEEN
Yeast Wet Prep HPF POC: NONE SEEN

## 2014-08-30 LAB — PREGNANCY, URINE: Preg Test, Ur: NEGATIVE

## 2014-08-30 MED ORDER — LEVONORGESTREL-ETHINYL ESTRAD 0.1-20 MG-MCG PO TABS: 1.0000 | ORAL_TABLET | Freq: Every day | ORAL | Status: DC

## 2014-08-30 NOTE — Telephone Encounter (Signed)
Patient [place] She called because she has 3 packs of Orsythia at home and wondered if that was same thing. I advised her it is generic for Allese and should be fine to take. I advised her to check expiration dates and be sure pills are still current date.

## 2014-08-30 NOTE — Progress Notes (Signed)
Patient ID: Brandy Newton, female   DOB: November 08, 1985, 29 y.o.   MRN: [id number] Presents with complaint of 3 day cycle this month when usual cycle/5-6 days and lower abdominal pain/cramping after cycle.  LMP 08/22/14 at normal time, same partner. Was taking Alesse, but stopped in July/August, no contraception since, but not desiring pregnancy at this time. Denies urinary changes, fever, vaginal discharge, constipation, diarrhea, or nausea/vomiting. Accompanied by boyfriend.   Exam: Appears well, speculum exam: no erythema or discharge, no odor, wet prep: Negative, UPT negative. Bimanual no CMT or adnexal fullness or tenderness.  Short cycle this month Contraception management  Plan: Contraception options reviewed, Alesse 0.1-20 mg-mcg daily decided upon, slight risks of blood clots and stroke reviewed, first month not contraceptive. Condoms encouraged.  Motrin as needed for discomfort.

## 2014-09-07 ENCOUNTER — Other Ambulatory Visit: Payer: Self-pay | Admitting: Neurology

## 2014-10-05 ENCOUNTER — Ambulatory Visit: Payer: BLUE CROSS/BLUE SHIELD | Admitting: Neurology

## 2014-10-07 ENCOUNTER — Other Ambulatory Visit: Payer: Self-pay | Admitting: Neurology

## 2014-10-08 ENCOUNTER — Other Ambulatory Visit: Payer: Self-pay | Admitting: Neurology

## 2014-12-03 ENCOUNTER — Ambulatory Visit (INDEPENDENT_AMBULATORY_CARE_PROVIDER_SITE_OTHER): Payer: BLUE CROSS/BLUE SHIELD | Admitting: Women's Health

## 2014-12-03 ENCOUNTER — Encounter: Payer: Self-pay | Admitting: Women's Health

## 2014-12-03 DIAGNOSIS — N898 Other specified noninflammatory disorders of vagina: Secondary | ICD-10-CM

## 2014-12-03 DIAGNOSIS — B3731 Acute candidiasis of vulva and vagina: Secondary | ICD-10-CM

## 2014-12-03 DIAGNOSIS — B373 Candidiasis of vulva and vagina: Secondary | ICD-10-CM | POA: Diagnosis not present

## 2014-12-03 LAB — WET PREP FOR TRICH, YEAST, CLUE
Clue Cells Wet Prep HPF POC: NONE SEEN
Trich, Wet Prep: NONE SEEN

## 2014-12-03 MED ORDER — FLUCONAZOLE 100 MG PO TABS: ORAL_TABLET | ORAL | Status: DC

## 2014-12-03 NOTE — Patient Instructions (Signed)

## 2014-12-03 NOTE — Progress Notes (Signed)
Patient ID: Brandy Newton, female   DOB: July 26, 1985, 29 y.o.   MRN: [id number] Presents with increased vaginal discharge for 5 days, mild odor with slight itching. History of recurrent BV and yeast. Same partner, monthly cycle on Aviane without problem. Denies abdominal pain, fever or urinary symptoms. Has tried boric acid in the past with poor results.  Exam: Appears well. External genitalia within normal limits, no visible erythema. Speculum exam moderate amount of a white curdy discharge, wet prep positive for yeast. No odor noted. Bimanual no CMT or tenderness.  Yeast vaginitis  Plan: Diflucan 100 mg 2 tablets today, repeat in 3 days if needed. Prescription, proper use given and reviewed, call if no relief. Yeast prevention discussed.

## 2014-12-24 ENCOUNTER — Ambulatory Visit (INDEPENDENT_AMBULATORY_CARE_PROVIDER_SITE_OTHER): Payer: BLUE CROSS/BLUE SHIELD | Admitting: Women's Health

## 2014-12-24 ENCOUNTER — Other Ambulatory Visit (HOSPITAL_COMMUNITY)
Admission: RE | Admit: 2014-12-24 | Discharge: 2014-12-24 | Disposition: A | Discharge status: Home / Self Care | Payer: BLUE CROSS/BLUE SHIELD | Source: Ambulatory Visit | Attending: Gynecology | Admitting: Gynecology

## 2014-12-24 ENCOUNTER — Encounter: Payer: Self-pay | Admitting: Women's Health

## 2014-12-24 VITALS — BP 120/70 | Ht 65.0 in | Wt 175.0 lb

## 2014-12-24 DIAGNOSIS — Z3041 Encounter for surveillance of contraceptive pills: Secondary | ICD-10-CM | POA: Diagnosis not present

## 2014-12-24 DIAGNOSIS — Z01419 Encounter for gynecological examination (general) (routine) without abnormal findings: Secondary | ICD-10-CM

## 2014-12-24 DIAGNOSIS — B3731 Acute candidiasis of vulva and vagina: Secondary | ICD-10-CM

## 2014-12-24 DIAGNOSIS — B373 Candidiasis of vulva and vagina: Secondary | ICD-10-CM | POA: Diagnosis not present

## 2014-12-24 LAB — CBC WITH DIFFERENTIAL/PLATELET
Basophils Absolute: 0 10*3/uL (ref 0.0–0.1)
Basophils Relative: 0 % (ref 0–1)
EOS PCT: 0 % (ref 0–5)
Eosinophils Absolute: 0 10*3/uL (ref 0.0–0.7)
HCT: 40.2 % (ref 36.0–46.0)
Hemoglobin: 13.8 g/dL (ref 12.0–15.0)
LYMPHS ABS: 2.6 10*3/uL (ref 0.7–4.0)
LYMPHS PCT: 37 % (ref 12–46)
MCH: 29.6 pg (ref 26.0–34.0)
MCHC: 34.3 g/dL (ref 30.0–36.0)
MCV: 86.1 fL (ref 78.0–100.0)
MONO ABS: 0.4 10*3/uL (ref 0.1–1.0)
MONOS PCT: 6 % (ref 3–12)
MPV: 9.4 fL (ref 8.6–12.4)
NEUTROS PCT: 57 % (ref 43–77)
Neutro Abs: 4 10*3/uL (ref 1.7–7.7)
PLATELETS: 357 10*3/uL (ref 150–400)
RBC: 4.67 MIL/uL (ref 3.87–5.11)
RDW: 13.7 % (ref 11.5–15.5)
WBC: 7 10*3/uL (ref 4.0–10.5)

## 2014-12-24 LAB — GLUCOSE, RANDOM: Glucose, Bld: 83 mg/dL (ref 65–99)

## 2014-12-24 LAB — WET PREP FOR TRICH, YEAST, CLUE
CLUE CELLS WET PREP: NONE SEEN
TRICH WET PREP: NONE SEEN

## 2014-12-24 MED ORDER — LEVONORGESTREL-ETHINYL ESTRAD 0.1-20 MG-MCG PO TABS: 1.0000 | ORAL_TABLET | Freq: Every day | ORAL | Status: DC

## 2014-12-24 NOTE — Progress Notes (Addendum)
Brandy Newton 01/16/1986 [id number]    History:    Presents for annual exam.  Light monthly cycle on Alesse. Same partner. Did not receive gardasil. History of migraines without aura, states now is having occasional ringing in the ears prior. Neurologist managing migraines, negative MRI.  Past medical history, past surgical history, family history and social history were all reviewed and documented in the EPIC chart. Graduate degree from [employer]. Father diabetes, mother MS.  ROS:  A ROS was performed and pertinent positives and negatives are included.  Exam:  Filed Vitals:   12/24/14 0918  BP: 120/70    General appearance:  Normal Thyroid:  Symmetrical, normal in size, without palpable masses or nodularity. Respiratory  Auscultation:  Clear without wheezing or rhonchi Cardiovascular  Auscultation:  Regular rate, without rubs, murmurs or gallops  Edema/varicosities:  Not grossly evident Abdominal  Soft,nontender, without masses, guarding or rebound.  Liver/spleen:  No organomegaly noted  Hernia:  None appreciated  Skin  Inspection:  Grossly normal   Breasts: Examined lying and sitting.     Right: Without masses, retractions, discharge or axillary adenopathy.     Left: Without masses, retractions, discharge or axillary adenopathy. Gentitourinary   Inguinal/mons:  Normal without inguinal adenopathy  External genitalia:  Normal  BUS/Urethra/Skene's glands:  Normal  Vagina:  Mild erythema, wet prep positive for yeast  Cervix:  Normal  Uterus:   normal in size, shape and contour.  Midline and mobile  Adnexa/parametria:     Rt: Without masses or tenderness.   Lt: Without masses or tenderness.  Anus and perineum: Normal  Digital rectal exam: Normal sphincter tone without palpated masses or tenderness  Assessment/Plan:  28 y.o.SBF G0  for annual exam.   Yeast vaginitis Light monthly cycle on Alesse Migraines with questionable aura-neurologist manages meds  Plan: Diflucan 150  by mouth 1 dose. Call if no relief of discharge. Reviewed best not to use combination OCs with questionable migraines with aura, reviewed risk is low but present. States would like to continue on OCs since has had trouble with other types. Reviewed levonogesterol, progesterone in Alesse safest. Will discuss with neurologist at next office visit. Alesse prescription, proper use, slight risk for blood clots and strokes especially with migraines reviewed. SBE's, exercise, calcium rich diet, MVI daily encouraged. CBC, glucose, rubella titer, UA, Pap, Pap normal 2014, new screening guidelines reviewed.    Harrington Challenger [facility], 5:48 PM 12/24/2014

## 2014-12-24 NOTE — Patient Instructions (Signed)

## 2014-12-25 LAB — URINALYSIS W MICROSCOPIC + REFLEX CULTURE
Bacteria, UA: NONE SEEN [HPF]
Bilirubin Urine: NEGATIVE
Casts: NONE SEEN [LPF]
Crystals: NONE SEEN [HPF]
Glucose, UA: NEGATIVE
Hgb urine dipstick: NEGATIVE
Ketones, ur: NEGATIVE
Leukocytes, UA: NEGATIVE
Nitrite: NEGATIVE
Protein, ur: NEGATIVE
RBC / HPF: NONE SEEN RBC/HPF
Specific Gravity, Urine: 1.009 (ref 1.001–1.035)
WBC, UA: NONE SEEN WBC/HPF
Yeast: NONE SEEN [HPF]
pH: 8 (ref 5.0–8.0)

## 2014-12-25 LAB — RUBELLA SCREEN: Rubella: 4.89 {index} — ABNORMAL HIGH

## 2014-12-26 LAB — CYTOLOGY - PAP

## 2015-01-27 ENCOUNTER — Other Ambulatory Visit: Payer: Self-pay | Admitting: Neurology

## 2015-02-11 ENCOUNTER — Other Ambulatory Visit: Payer: Self-pay | Admitting: Neurology

## 2015-02-12 NOTE — Telephone Encounter (Signed)
Can patient have refills, last refill was in January

## 2015-08-30 DIAGNOSIS — F4323 Adjustment disorder with mixed anxiety and depressed mood: Secondary | ICD-10-CM | POA: Diagnosis not present

## 2015-09-17 DIAGNOSIS — F4323 Adjustment disorder with mixed anxiety and depressed mood: Secondary | ICD-10-CM | POA: Diagnosis not present

## 2015-09-25 ENCOUNTER — Other Ambulatory Visit: Payer: Self-pay

## 2015-09-25 DIAGNOSIS — J45990 Exercise induced bronchospasm: Secondary | ICD-10-CM

## 2015-09-25 MED ORDER — ALBUTEROL SULFATE HFA 108 (90 BASE) MCG/ACT IN AERS: 2.0000 | INHALATION_SPRAY | Freq: Four times a day (QID) | RESPIRATORY_TRACT | Status: DC | PRN

## 2015-10-08 DIAGNOSIS — F4323 Adjustment disorder with mixed anxiety and depressed mood: Secondary | ICD-10-CM | POA: Diagnosis not present

## 2015-10-29 ENCOUNTER — Ambulatory Visit (INDEPENDENT_AMBULATORY_CARE_PROVIDER_SITE_OTHER): Payer: BLUE CROSS/BLUE SHIELD | Admitting: Women's Health

## 2015-10-29 ENCOUNTER — Encounter: Payer: Self-pay | Admitting: Women's Health

## 2015-10-29 VITALS — BP 120/82 | Ht 65.0 in | Wt 175.0 lb

## 2015-10-29 DIAGNOSIS — B3731 Acute candidiasis of vulva and vagina: Secondary | ICD-10-CM

## 2015-10-29 DIAGNOSIS — N912 Amenorrhea, unspecified: Secondary | ICD-10-CM

## 2015-10-29 DIAGNOSIS — R350 Frequency of micturition: Secondary | ICD-10-CM

## 2015-10-29 DIAGNOSIS — B373 Candidiasis of vulva and vagina: Secondary | ICD-10-CM | POA: Diagnosis not present

## 2015-10-29 LAB — URINALYSIS W MICROSCOPIC + REFLEX CULTURE
BACTERIA UA: NONE SEEN [HPF]
Bilirubin Urine: NEGATIVE
CASTS: NONE SEEN [LPF]
CRYSTALS: NONE SEEN [HPF]
Glucose, UA: NEGATIVE
Hgb urine dipstick: NEGATIVE
KETONES UR: NEGATIVE
Leukocytes, UA: NEGATIVE
Nitrite: NEGATIVE
RBC / HPF: NONE SEEN RBC/HPF (ref <=2)
SPECIFIC GRAVITY, URINE: 1.015 (ref 1.001–1.035)
WBC, UA: NONE SEEN WBC/HPF (ref <=5)
Yeast: NONE SEEN [HPF]
pH: 8 (ref 5.0–8.0)

## 2015-10-29 LAB — PREGNANCY, URINE: PREG TEST UR: NEGATIVE

## 2015-10-29 LAB — WET PREP FOR TRICH, YEAST, CLUE
Clue Cells Wet Prep HPF POC: NONE SEEN
Trich, Wet Prep: NONE SEEN
YEAST WET PREP: NONE SEEN

## 2015-10-29 MED ORDER — FLUCONAZOLE 150 MG PO TABS: 150.0000 mg | ORAL_TABLET | Freq: Once | ORAL | Status: DC

## 2015-10-29 NOTE — Progress Notes (Signed)
Patient ID: Brandy Newton, female   DOB: 03-11-1986, 30 y.o.   MRN: [id number] Presents with complaint of vaginal irritation with itching for several days. Just returned from a weeks [place] vacation. Same partner, on alesse with several missed pills. Denies urinary symptoms of pain, burning or frequency, abdominal pain or fever.   Exam: Appears well. External genitalia mild erythema at introitus, speculum exam scant discharge mild erythema, wet prep negative. Bimanual no tenderness uterus small. UA: Negative, UPT negative  Clinical yeast  Plan: Diflucan 150 by mouth times one dose with refill. Yeast prevention discussed. Instructed to call if no relief of symptoms. Ways to remember OCs daily reviewed. Annual exam in August.

## 2015-10-29 NOTE — Patient Instructions (Signed)

## 2015-10-31 DIAGNOSIS — F4323 Adjustment disorder with mixed anxiety and depressed mood: Secondary | ICD-10-CM | POA: Diagnosis not present

## 2015-12-08 ENCOUNTER — Other Ambulatory Visit: Payer: Self-pay | Admitting: Neurology

## 2015-12-09 DIAGNOSIS — F4323 Adjustment disorder with mixed anxiety and depressed mood: Secondary | ICD-10-CM | POA: Diagnosis not present

## 2015-12-13 ENCOUNTER — Other Ambulatory Visit: Payer: Self-pay | Admitting: *Deleted

## 2015-12-13 ENCOUNTER — Telehealth: Payer: Self-pay | Admitting: Neurology

## 2015-12-13 MED ORDER — TOPIRAMATE 25 MG PO TABS: 50.0000 mg | ORAL_TABLET | Freq: Every day | ORAL | 0 refills | Status: DC

## 2015-12-13 NOTE — Telephone Encounter (Signed)
Brandy Newton 07-02-1985. She called needing a medication refill. [facility] [address] has been unable to get the medication filled. Her # is [phone]. Thank you

## 2015-12-13 NOTE — Telephone Encounter (Signed)
She needs an appointment with Dr. Everlena Cooper.

## 2015-12-16 ENCOUNTER — Ambulatory Visit: Payer: BLUE CROSS/BLUE SHIELD | Admitting: Neurology

## 2015-12-29 ENCOUNTER — Other Ambulatory Visit: Payer: Self-pay | Admitting: Women's Health

## 2015-12-29 DIAGNOSIS — Z3041 Encounter for surveillance of contraceptive pills: Secondary | ICD-10-CM

## 2016-01-20 ENCOUNTER — Other Ambulatory Visit: Payer: Self-pay | Admitting: Neurology

## 2016-01-30 ENCOUNTER — Encounter: Payer: BLUE CROSS/BLUE SHIELD | Admitting: Women's Health

## 2016-01-31 DIAGNOSIS — L920 Granuloma annulare: Secondary | ICD-10-CM | POA: Diagnosis not present

## 2016-02-03 DIAGNOSIS — F4323 Adjustment disorder with mixed anxiety and depressed mood: Secondary | ICD-10-CM | POA: Diagnosis not present

## 2016-02-13 ENCOUNTER — Encounter: Payer: Self-pay | Admitting: Women's Health

## 2016-02-13 ENCOUNTER — Encounter: Payer: Self-pay | Admitting: Neurology

## 2016-02-13 ENCOUNTER — Ambulatory Visit (INDEPENDENT_AMBULATORY_CARE_PROVIDER_SITE_OTHER): Payer: BLUE CROSS/BLUE SHIELD | Admitting: Women's Health

## 2016-02-13 ENCOUNTER — Ambulatory Visit (INDEPENDENT_AMBULATORY_CARE_PROVIDER_SITE_OTHER): Payer: BLUE CROSS/BLUE SHIELD | Admitting: Neurology

## 2016-02-13 VITALS — HR 73 | Ht 66.0 in | Wt 190.0 lb

## 2016-02-13 VITALS — BP 106/70 | Ht 65.0 in | Wt 190.0 lb

## 2016-02-13 DIAGNOSIS — H52223 Regular astigmatism, bilateral: Secondary | ICD-10-CM | POA: Diagnosis not present

## 2016-02-13 DIAGNOSIS — G43009 Migraine without aura, not intractable, without status migrainosus: Secondary | ICD-10-CM | POA: Diagnosis not present

## 2016-02-13 DIAGNOSIS — Z01419 Encounter for gynecological examination (general) (routine) without abnormal findings: Secondary | ICD-10-CM | POA: Diagnosis not present

## 2016-02-13 DIAGNOSIS — Z30011 Encounter for initial prescription of contraceptive pills: Secondary | ICD-10-CM | POA: Diagnosis not present

## 2016-02-13 LAB — CBC WITH DIFFERENTIAL/PLATELET
BASOS PCT: 0 %
Basophils Absolute: 0 cells/uL (ref 0–200)
EOS PCT: 0 %
Eosinophils Absolute: 0 cells/uL — ABNORMAL LOW (ref 15–500)
HCT: 40 % (ref 35.0–45.0)
Hemoglobin: 13.1 g/dL (ref 11.7–15.5)
LYMPHS PCT: 45 %
Lymphs Abs: 2970 cells/uL (ref 850–3900)
MCH: 29.1 pg (ref 27.0–33.0)
MCHC: 32.8 g/dL (ref 32.0–36.0)
MCV: 88.9 fL (ref 80.0–100.0)
MPV: 9.3 fL (ref 7.5–12.5)
Monocytes Absolute: 462 cells/uL (ref 200–950)
Monocytes Relative: 7 %
Neutro Abs: 3168 cells/uL (ref 1500–7800)
Neutrophils Relative %: 48 %
PLATELETS: 392 10*3/uL (ref 140–400)
RBC: 4.5 MIL/uL (ref 3.80–5.10)
RDW: 13.8 % (ref 11.0–15.0)
WBC: 6.6 10*3/uL (ref 3.8–10.8)

## 2016-02-13 LAB — GLUCOSE, RANDOM: GLUCOSE: 83 mg/dL (ref 65–99)

## 2016-02-13 MED ORDER — NORETHINDRONE 0.35 MG PO TABS: 1.0000 | ORAL_TABLET | Freq: Every day | ORAL | 4 refills | Status: DC

## 2016-02-13 MED ORDER — SUMATRIPTAN SUCCINATE 100 MG PO TABS: ORAL_TABLET | ORAL | 7 refills | Status: DC

## 2016-02-13 MED ORDER — TOPIRAMATE 25 MG PO TABS
50.0000 mg | ORAL_TABLET | Freq: Every day | ORAL | 7 refills | Status: DC
Start: 2016-02-13 — End: 2016-05-21

## 2016-02-13 NOTE — Patient Instructions (Signed)
Continue topiramate 50mg  at bedtime Use sumatriptan 100mg  at earliest onset of headache.  May repeat dose once in 2 hours if needed.  Do not exceed two tablets in 24 hours Follow up in 8 months.

## 2016-02-13 NOTE — Progress Notes (Signed)
NEUROLOGY FOLLOW UP OFFICE NOTE  Brandy Newton [id number]  HISTORY OF PRESENT ILLNESS: Brandy Newton is a 30 year old right-handed woman with history of asthma and mgraine who follows up for migraine.  MRI of brain reviewed.  UPDATE: She had an MRI of the brain with and without contrast performed in March 2016, which was unremarkable.  She is doing well.  She stopped her birth control a couple of months ago and headaches have significantly decreased in frequency Intensity:  6-10/10 Duration:  1 to 2 hours with sumatriptan Frequency:  1 to 2 days per month  Current abortive therapy:  sumatriptan 100mg    Current preventative therapy:  Topamax 50mg   Caffeine:  Coffee twice a week.  No soda or tea Alcohol:  no Smoker:  no Diet:  Healthy.  Lost 10 lbs.  Drinks plenty of water Exercise:  regularly Depression/stress:  Some stress related to work.  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  She has frequent nightmares that cause stress and prevents her from falling back to sleep.  HISTORY: Onset:  30 years old Location:  Unilateral, either side, but can spread across forehead Quality:  Throbbing and squeezing Initial Intensity:  6-9/10; February 6-10/10 Aura:  Scotomas and blurred vision in either eye for 10 minutes Prodrome:  no Associated symptoms:  Dizziness, photophobia, phonophobia.  She has had migraines that were accompanied by paresthesias. Initial Duration:  2-4 hours; February 2 hours if sumatriptan works (otherwise all day) Initial Frequency:  2-3 times a week (8-12 migraine days per month).  However, she also has nagging headache, so total of 18 headache days per month; February 15-17 headache days per month Triggers/exacerbating factors:  Stress, NovaRing Relieving factors:  Dark quiet room Activity:  Cannot function about 1-2 days per month.  Past abortive therapy:  Excedrin and Excedrin  Migraine (initially helpful), Advil, Aleve, Tylenol, Percocet.  BC powder rarely Past preventative therapy:  Does not remember  Family history of headache:  No Family neurologic history:  Mom has MS.  No family history of intracranial aneurysms.  PAST MEDICAL HISTORY: Past Medical History:  Diagnosis Date  . Anemia   . Asthma   . Headache     MEDICATIONS: Current Outpatient Prescriptions on File Prior to Visit  Medication Sig Dispense Refill  . albuterol (PROVENTIL HFA;VENTOLIN HFA) 108 (90 Base) MCG/ACT inhaler Inhale 2 puffs into the lungs every 6 (six) hours as needed for wheezing or shortness of breath. 1 Inhaler 2  . ORSYTHIA 0.1-20 MG-MCG tablet TAKE 1 TABLET BY MOUTH DAILY. (Patient not taking: Reported on 02/13/2016) 84 tablet 0   No current facility-administered medications on file prior to visit.     ALLERGIES: No Known Allergies  FAMILY HISTORY: Family History  Problem Relation Age of Onset  . Hypertension Father   . Diabetes Father   . Hypertension Paternal Grandmother   . Diabetes Paternal Grandmother   . Hypertension Paternal Grandfather   . Diabetes Paternal Grandfather   . Diabetes Maternal Grandmother   . Diabetes Maternal Grandfather   . Multiple sclerosis Mother     SOCIAL HISTORY: Social History   Social History  . Marital status: Single    Spouse name: N/A  . Number of children: N/A  . Years of education: N/A   Occupational History  . Not on file.   Social History Main Topics  . Smoking status: Never Smoker  .  Smokeless tobacco: Not on file  . Alcohol use No     Comment: SOCIAL  . Drug use: No  . Sexual activity: Yes    Partners: Male    Birth control/ protection: Condom, Pill     Comment: 1st intercourse 30 yo-More than 5 partners   Other Topics Concern  . Not on file   Social History Narrative  . No narrative on file    REVIEW OF SYSTEMS: Constitutional: No fevers, chills, or sweats, no generalized fatigue, change in  appetite Eyes: No visual changes, double vision, eye pain Ear, nose and throat: No hearing loss, ear pain, nasal congestion, sore throat Cardiovascular: No chest pain, palpitations Respiratory:  No shortness of breath at rest or with exertion, wheezes GastrointestinaI: No nausea, vomiting, diarrhea, abdominal pain, fecal incontinence Genitourinary:  No dysuria, urinary retention or frequency Musculoskeletal:  No neck pain, back pain Integumentary: No rash, pruritus, skin lesions Neurological: as above Psychiatric: No depression, insomnia, anxiety Endocrine: No palpitations, fatigue, diaphoresis, mood swings, change in appetite, change in weight, increased thirst Hematologic/Lymphatic:  No purpura, petechiae. Allergic/Immunologic: no itchy/runny eyes, nasal congestion, recent allergic reactions, rashes  PHYSICAL EXAM: Vitals:   02/13/16 0736  Pulse: 73   General: No acute distress.  Patient appears well-groomed.   Head:  Normocephalic/atraumatic Eyes:  Fundi examined but not visualized Neck: supple, no paraspinal tenderness, full range of motion Heart:  Regular rate and rhythm Lungs:  Clear to auscultation bilaterally Back: No paraspinal tenderness Neurological Exam: alert and oriented to person, place, and time. Attention span and concentration intact, recent and remote memory intact, fund of knowledge intact.  Speech fluent and not dysarthric, language intact.  CN II-XII intact. Bulk and tone normal, muscle strength 5/5 throughout.  Sensation to light touch  intact.  Deep tendon reflexes 2+ throughout.  Finger to nose testing intact.  Gait normal, Romberg negative.  IMPRESSION: Migraine  PLAN: Continue topiramate 50mg  at bedtime for now and sumatriptan 100mg  as needed.  Advised not to get pregnant while on topiramate.  Follow up in 8 months.  If headaches still well controlled, we will discuss discontinuing topiramate as the birth control medication was likely the trigger.  15  minutes spent face to face with patient, over 50% spent counseling.  Shon MilletAdam Cotey Rakes, DO  CC:  Maryelizabeth RowanNancy Young, NP

## 2016-02-13 NOTE — Progress Notes (Signed)
Brandy Newton 11/22/1985 [id number]    History:    Presents for annual exam.  Monthly cycle/condoms/same partner greater than 5 years.Kem Parkinson. Stopped Alesse about 2 months ago per neurologist for migraines/headaches which have improved since being off. Negative MRI. Normal Pap history. Did not receive gardasil. Rubella immune.   Past medical history, past surgical history, family history and social history were all reviewed and documented in the EPIC chart. [place]. Works part-time at [employer]. Graduated from [employer]. Father diabetes, mother MS.  ROS:  A ROS was performed and pertinent positives and negatives are included.  Exam:  Vitals:   02/13/16 0950  BP: 106/70  Weight: 190 lb (86.2 kg)  Height: 5\' 5"  (1.651 m)   Body mass index is 31.62 kg/m.   General appearance:  Normal Thyroid:  Symmetrical, normal in size, without palpable masses or nodularity. Respiratory  Auscultation:  Clear without wheezing or rhonchi Cardiovascular  Auscultation:  Regular rate, without rubs, murmurs or gallops  Edema/varicosities:  Not grossly evident Abdominal  Soft,nontender, without masses, guarding or rebound.  Liver/spleen:  No organomegaly noted  Hernia:  None appreciated  Skin  Inspection:  Grossly normal   Breasts: Examined lying and sitting.     Right: Without masses, retractions, discharge or axillary adenopathy.     Left: Without masses, retractions, discharge or axillary adenopathy. Gentitourinary   Inguinal/mons:  Normal without inguinal adenopathy  External genitalia:  Normal  BUS/Urethra/Skene's glands:  Normal  Vagina:  Normal  Cervix:  Normal  Uterus:  normal in size, shape and contour.  Midline and mobile  Adnexa/parametria:     Rt: Without masses or tenderness.   Lt: Without masses or tenderness.  Anus and perineum: Normal  Digital rectal exam: Normal sphincter tone without palpated masses or tenderness  Assessment/Plan:  30 y.o. SBF G0 for annual  exam with no complaints.  Monthly cycle/condoms/same Contraception management Migraines-neurologist managing.  Plan: Contraception options reviewed, Micronor prescription, proper use given and reviewed start up instructions, condoms especially first month. SBE's, continue regular exercise, decrease calories for weight loss, calcium rich diet, MVI daily encouraged. CBC, glucose, UA, Pap normal 2016  new screening guidelines reviewed. Denies need for STD screen.   Harrington ChallengerYOUNG,Judye Lorino J [facility]ry CenterWHNP, 10:35 AM 02/13/2016

## 2016-02-13 NOTE — Progress Notes (Signed)
Chart forwarded.  

## 2016-02-13 NOTE — Patient Instructions (Addendum)
Health Maintenance, Female Adopting a healthy lifestyle and getting preventive care can go a long way to promote health and wellness. Talk with your health care provider about what schedule of regular examinations is right for you. This is a good chance for you to check in with your provider about disease prevention and staying healthy. In between checkups, there are plenty of things you can do on your own. Experts have done a lot of research about which lifestyle changes and preventive measures are most likely to keep you healthy. Ask your health care provider for more information. WEIGHT AND DIET  Eat a healthy diet  Be sure to include plenty of vegetables, fruits, low-fat dairy products, and lean protein.  Do not eat a lot of foods high in solid fats, added sugars, or salt.  Get regular exercise. This is one of the most important things you can do for your health.  Most adults should exercise for at least 150 minutes each week. The exercise should increase your heart rate and make you sweat (moderate-intensity exercise).  Most adults should also do strengthening exercises at least twice a week. This is in addition to the moderate-intensity exercise.  Maintain a healthy weight  Body mass index (BMI) is a measurement that can be used to identify possible weight problems. It estimates body fat based on height and weight. Your health care provider can help determine your BMI and help you achieve or maintain a healthy weight.  For females 20 years of age and older:   A BMI below 18.5 is considered underweight.  A BMI of 18.5 to 24.9 is normal.  A BMI of 25 to 29.9 is considered overweight.  A BMI of 30 and above is considered obese.  Watch levels of cholesterol and blood lipids  You should start having your blood tested for lipids and cholesterol at 29 years of age, then have this test every 5 years.  You may need to have your cholesterol levels checked more often if:  Your lipid  or cholesterol levels are high.  You are older than 29 years of age.  You are at high risk for heart disease.  CANCER SCREENING   Lung Cancer  Lung cancer screening is recommended for adults 55-80 years old who are at high risk for lung cancer because of a history of smoking.  A yearly low-dose CT scan of the lungs is recommended for people who:  Currently smoke.  Have quit within the past 15 years.  Have at least a 30-pack-year history of smoking. A pack year is smoking an average of one pack of cigarettes a day for 1 year.  Yearly screening should continue until it has been 15 years since you quit.  Yearly screening should stop if you develop a health problem that would prevent you from having lung cancer treatment.  Breast Cancer  Practice breast self-awareness. This means understanding how your breasts normally appear and feel.  It also means doing regular breast self-exams. Let your health care provider know about any changes, no matter how small.  If you are in your 20s or 30s, you should have a clinical breast exam (CBE) by a health care provider every 1-3 years as part of a regular health exam.  If you are 40 or older, have a CBE every year. Also consider having a breast X-ray (mammogram) every year.  If you have a family history of breast cancer, talk to your health care provider about genetic screening.  If you   are at high risk for breast cancer, talk to your health care provider about having an MRI and a mammogram every year.  Breast cancer gene (BRCA) assessment is recommended for women who have family members with BRCA-related cancers. BRCA-related cancers include:  Breast.  Ovarian.  Tubal.  Peritoneal cancers.  Results of the assessment will determine the need for genetic counseling and BRCA1 and BRCA2 testing. Cervical Cancer Your health care provider may recommend that you be screened regularly for cancer of the pelvic organs (ovaries, uterus, and  vagina). This screening involves a pelvic examination, including checking for microscopic changes to the surface of your cervix (Pap test). You may be encouraged to have this screening done every 3 years, beginning at age 21.  For women ages 30-65, health care providers may recommend pelvic exams and Pap testing every 3 years, or they may recommend the Pap and pelvic exam, combined with testing for human papilloma virus (HPV), every 5 years. Some types of HPV increase your risk of cervical cancer. Testing for HPV may also be done on women of any age with unclear Pap test results.  Other health care providers may not recommend any screening for nonpregnant women who are considered low risk for pelvic cancer and who do not have symptoms. Ask your health care provider if a screening pelvic exam is right for you.  If you have had past treatment for cervical cancer or a condition that could lead to cancer, you need Pap tests and screening for cancer for at least 20 years after your treatment. If Pap tests have been discontinued, your risk factors (such as having a new sexual partner) need to be reassessed to determine if screening should resume. Some women have medical problems that increase the chance of getting cervical cancer. In these cases, your health care provider may recommend more frequent screening and Pap tests. Colorectal Cancer  This type of cancer can be detected and often prevented.  Routine colorectal cancer screening usually begins at 29 years of age and continues through 29 years of age.  Your health care provider may recommend screening at an earlier age if you have risk factors for colon cancer.  Your health care provider may also recommend using home test kits to check for hidden blood in the stool.  A small camera at the end of a tube can be used to examine your colon directly (sigmoidoscopy or colonoscopy). This is done to check for the earliest forms of colorectal  cancer.  Routine screening usually begins at age 50.  Direct examination of the colon should be repeated every 5-10 years through 29 years of age. However, you may need to be screened more often if early forms of precancerous polyps or small growths are found. Skin Cancer  Check your skin from head to toe regularly.  Tell your health care provider about any new moles or changes in moles, especially if there is a change in a mole's shape or color.  Also tell your health care provider if you have a mole that is larger than the size of a pencil eraser.  Always use sunscreen. Apply sunscreen liberally and repeatedly throughout the day.  Protect yourself by wearing long sleeves, pants, a wide-brimmed hat, and sunglasses whenever you are outside. HEART DISEASE, DIABETES, AND HIGH BLOOD PRESSURE   High blood pressure causes heart disease and increases the risk of stroke. High blood pressure is more likely to develop in:  People who have blood pressure in the high end   of the normal range (130-139/85-89 mm Hg).  People who are overweight or obese.  People who are African American.  If you are 38-23 years of age, have your blood pressure checked every 3-5 years. If you are 61 years of age or older, have your blood pressure checked every year. You should have your blood pressure measured twice--once when you are at a hospital or clinic, and once when you are not at a hospital or clinic. Record the average of the two measurements. To check your blood pressure when you are not at a hospital or clinic, you can use:  An automated blood pressure machine at a pharmacy.  A home blood pressure monitor.  If you are between 45 years and 39 years old, ask your health care provider if you should take aspirin to prevent strokes.  Have regular diabetes screenings. This involves taking a blood sample to check your fasting blood sugar level.  If you are at a normal weight and have a low risk for diabetes,  have this test once every three years after 29 years of age.  If you are overweight and have a high risk for diabetes, consider being tested at a younger age or more often. PREVENTING INFECTION  Hepatitis B  If you have a higher risk for hepatitis B, you should be screened for this virus. You are considered at high risk for hepatitis B if:  You were born in a country where hepatitis B is common. Ask your health care provider which countries are considered high risk.  Your parents were born in a high-risk country, and you have not been immunized against hepatitis B (hepatitis B vaccine).  You have HIV or AIDS.  You use needles to inject street drugs.  You live with someone who has hepatitis B.  You have had sex with someone who has hepatitis B.  You get hemodialysis treatment.  You take certain medicines for conditions, including cancer, organ transplantation, and autoimmune conditions. Hepatitis C  Blood testing is recommended for:  Everyone born from [birth year] through 1964.  Anyone with known risk factors for hepatitis C. Sexually transmitted infections (STIs)  You should be screened for sexually transmitted infections (STIs) including gonorrhea and chlamydia if:  You are sexually active and are younger than 29 years of age.  You are older than 29 years of age and your health care provider tells you that you are at risk for this type of infection.  Your sexual activity has changed since you were last screened and you are at an increased risk for chlamydia or gonorrhea. Ask your health care provider if you are at risk.  If you do not have HIV, but are at risk, it may be recommended that you take a prescription medicine daily to prevent HIV infection. This is called pre-exposure prophylaxis (PrEP). You are considered at risk if:  You are sexually active and do not regularly use condoms or know the HIV status of your partner(s).  You take drugs by injection.  You are sexually  active with a partner who has HIV. Talk with your health care provider about whether you are at high risk of being infected with HIV. If you choose to begin PrEP, you should first be tested for HIV. You should then be tested every 3 months for as long as you are taking PrEP.  PREGNANCY   If you are premenopausal and you may become pregnant, ask your health care provider about preconception counseling.  If you may  become pregnant, take 400 to 800 micrograms (mcg) of folic acid every day.  If you want to prevent pregnancy, talk to your health care provider about birth control (contraception). OSTEOPOROSIS AND MENOPAUSE   Osteoporosis is a disease in which the bones lose minerals and strength with aging. This can result in serious bone fractures. Your risk for osteoporosis can be identified using a bone density scan.  If you are 42 years of age or older, or if you are at risk for osteoporosis and fractures, ask your health care provider if you should be screened.  Ask your health care provider whether you should take a calcium or vitamin D supplement to lower your risk for osteoporosis.  Menopause may have certain physical symptoms and risks.  Hormone replacement therapy may reduce some of these symptoms and risks. Talk to your health care provider about whether hormone replacement therapy is right for you.  HOME CARE INSTRUCTIONS   Schedule regular health, dental, and eye exams.  Stay current with your immunizations.   Do not use any tobacco products including cigarettes, chewing tobacco, or electronic cigarettes.  If you are pregnant, do not drink alcohol.  If you are breastfeeding, limit how much and how often you drink alcohol.  Limit alcohol intake to no more than 1 drink per day for nonpregnant women. One drink equals 12 ounces of beer, 5 ounces of wine, or 1 ounces of hard liquor.  Do not use street drugs.  Do not share needles.  Ask your health care provider for help if  you need support or information about quitting drugs.  Tell your health care provider if you often feel depressed.  Tell your health care provider if you have ever been abused or do not feel safe at home.   This information is not intended to replace advice given to you by your health care provider. Make sure you discuss any questions you have with your health care provider.   Document Released: 11/17/2010 Document Revised: 05/25/2014 Document Reviewed: 04/05/2013 Elsevier Interactive Patient Education 2016 [place] Carbohydrate Counting for Diabetes Mellitus Carbohydrate counting is a method for keeping track of the amount of carbohydrates you eat. Eating carbohydrates naturally increases the level of sugar (glucose) in your blood, so it is important for you to know the amount that is okay for you to have in every meal. Carbohydrate counting helps keep the level of glucose in your blood within normal limits. The amount of carbohydrates allowed is different for every person. A dietitian can help you calculate the amount that is right for you. Once you know the amount of carbohydrates you can have, you can count the carbohydrates in the foods you want to eat. Carbohydrates are found in the following foods:  Grains, such as breads and cereals.  Dried beans and soy products.  Starchy vegetables, such as potatoes, peas, and corn.  Fruit and fruit juices.  Milk and yogurt.  Sweets and snack foods, such as cake, cookies, candy, chips, soft drinks, and fruit drinks. CARBOHYDRATE COUNTING There are two ways to count the carbohydrates in your food. You can use either of the methods or a combination of both. Reading the "Nutrition Facts" on [place] The "Nutrition Facts" is an area that is included on the labels of almost all packaged food and beverages in the [place]. It includes the serving size of that food or beverage and information about the nutrients in each serving of  the food, including the grams (g)  of carbohydrate per serving.  Decide the number of servings of this food or beverage that you will be able to eat or drink. Multiply that number of servings by the number of grams of carbohydrate that is listed on the label for that serving. The total will be the amount of carbohydrates you will be having when you eat or drink this food or beverage. Learning Standard Serving Sizes of Food When you eat food that is not packaged or does not include "Nutrition Facts" on the label, you need to measure the servings in order to count the amount of carbohydrates.A serving of most carbohydrate-rich foods contains about 15 g of carbohydrates. The following list includes serving sizes of carbohydrate-rich foods that provide 15 g ofcarbohydrate per serving:   1 slice of bread (1 oz) or 1 six-inch tortilla.    of a hamburger bun or English muffin.  4-6 crackers.   cup unsweetened dry cereal.    cup hot cereal.   cup rice or pasta.    cup mashed potatoes or  of a large baked potato.  1 cup fresh fruit or one small piece of fruit.    cup canned or frozen fruit or fruit juice.  1 cup milk.   cup plain fat-free yogurt or yogurt sweetened with artificial sweeteners.   cup cooked dried beans or starchy vegetable, such as peas, corn, or potatoes.  Decide the number of standard-size servings that you will eat. Multiply that number of servings by 15 (the grams of carbohydrates in that serving). For example, if you eat 2 cups of strawberries, you will have eaten 2 servings and 30 g of carbohydrates (2 servings x 15 g = 30 g). For foods such as soups and casseroles, in which more than one food is mixed in, you will need to count the carbohydrates in each food that is included. EXAMPLE OF CARBOHYDRATE COUNTING Sample Dinner  3 oz chicken breast.   cup of brown rice.   cup of corn.  1 cup milk.   1 cup strawberries with sugar-free whipped topping.   Carbohydrate Calculation Step 1: Identify the foods that contain carbohydrates:   Rice.   Corn.   Milk.   Strawberries. Step 2:Calculate the number of servings eaten of each:   2 servings of rice.   1 serving of corn.   1 serving of milk.   1 serving of strawberries. Step 3: Multiply each of those number of servings by 15 g:   2 servings of rice x 15 g = 30 g.   1 serving of corn x 15 g = 15 g.   1 serving of milk x 15 g = 15 g.   1 serving of strawberries x 15 g = 15 g. Step 4: Add together all of the amounts to find the total grams of carbohydrates eaten: 30 g + 15 g + 15 g + 15 g = 75 g.   This information is not intended to replace advice given to you by your health care provider. Make sure you discuss any questions you have with your health care provider.   Document Released: 05/04/2005 Document Revised: 05/25/2014 Document Reviewed: 03/31/2013 Elsevier Interactive Patient Education [employer].

## 2016-02-14 LAB — URINALYSIS W MICROSCOPIC + REFLEX CULTURE
BACTERIA UA: NONE SEEN [HPF]
Bilirubin Urine: NEGATIVE
Casts: NONE SEEN [LPF]
Crystals: NONE SEEN [HPF]
GLUCOSE, UA: NEGATIVE
HGB URINE DIPSTICK: NEGATIVE
Ketones, ur: NEGATIVE
LEUKOCYTES UA: NEGATIVE
Nitrite: NEGATIVE
Protein, ur: NEGATIVE
RBC / HPF: NONE SEEN RBC/HPF (ref <=2)
SQUAMOUS EPITHELIAL / LPF: NONE SEEN [HPF] (ref <=5)
Specific Gravity, Urine: 1.018 (ref 1.001–1.035)
WBC, UA: NONE SEEN WBC/HPF (ref <=5)
Yeast: NONE SEEN [HPF]
pH: 5.5 (ref 5.0–8.0)

## 2016-02-28 DIAGNOSIS — Z23 Encounter for immunization: Secondary | ICD-10-CM | POA: Diagnosis not present

## 2016-03-09 DIAGNOSIS — F4323 Adjustment disorder with mixed anxiety and depressed mood: Secondary | ICD-10-CM | POA: Diagnosis not present

## 2016-03-13 ENCOUNTER — Other Ambulatory Visit: Payer: Self-pay | Admitting: Women's Health

## 2016-03-13 DIAGNOSIS — B3731 Acute candidiasis of vulva and vagina: Secondary | ICD-10-CM

## 2016-03-13 DIAGNOSIS — B373 Candidiasis of vulva and vagina: Secondary | ICD-10-CM

## 2016-03-13 NOTE — Telephone Encounter (Signed)
Okay for one dose office visit if no relief

## 2016-03-19 ENCOUNTER — Other Ambulatory Visit: Payer: Self-pay | Admitting: Women's Health

## 2016-03-19 DIAGNOSIS — Z3041 Encounter for surveillance of contraceptive pills: Secondary | ICD-10-CM

## 2016-04-02 ENCOUNTER — Telehealth: Payer: Self-pay | Admitting: Neurology

## 2016-04-02 NOTE — Telephone Encounter (Signed)
Brandy Newton [birth year] Her # [phone]. She has had a Migraine for the past few days. She said it has eased up a little. She would like to come in today to get some relief with it. Thank you

## 2016-04-15 DIAGNOSIS — F4323 Adjustment disorder with mixed anxiety and depressed mood: Secondary | ICD-10-CM | POA: Diagnosis not present

## 2016-05-21 ENCOUNTER — Encounter: Payer: Self-pay | Admitting: Women's Health

## 2016-05-21 ENCOUNTER — Ambulatory Visit (INDEPENDENT_AMBULATORY_CARE_PROVIDER_SITE_OTHER): Payer: BLUE CROSS/BLUE SHIELD | Admitting: Women's Health

## 2016-05-21 VITALS — BP 126/78 | Ht 65.0 in | Wt 190.0 lb

## 2016-05-21 DIAGNOSIS — O3680X0 Pregnancy with inconclusive fetal viability, not applicable or unspecified: Secondary | ICD-10-CM

## 2016-05-21 DIAGNOSIS — Z3201 Encounter for pregnancy test, result positive: Secondary | ICD-10-CM

## 2016-05-21 DIAGNOSIS — N912 Amenorrhea, unspecified: Secondary | ICD-10-CM | POA: Diagnosis not present

## 2016-05-21 LAB — PREGNANCY, URINE: PREG TEST UR: POSITIVE — AB

## 2016-05-21 NOTE — Patient Instructions (Signed)
First Trimester of Pregnancy  The first trimester of pregnancy is from week 1 until the end of week 12 (months 1 through 3). A week after a sperm fertilizes an egg, the egg will implant on the wall of the uterus. This embryo will begin to develop into a baby. Genes from you and your partner are forming the baby. The female genes determine whether the baby is a boy or a girl. At 6-8 weeks, the eyes and face are formed, and the heartbeat can be seen on ultrasound. At the end of 12 weeks, all the baby's organs are formed.   Now that you are pregnant, you will want to do everything you can to have a healthy baby. Two of the most important things are to get good prenatal care and to follow your health care provider's instructions. Prenatal care is all the medical care you receive before the baby's birth. This care will help prevent, find, and treat any problems during the pregnancy and childbirth.  BODY CHANGES  Your body goes through many changes during pregnancy. The changes vary from woman to woman.   · You may gain or lose a couple of pounds at first.  · You may feel sick to your stomach (nauseous) and throw up (vomit). If the vomiting is uncontrollable, call your health care provider.  · You may tire easily.  · You may develop headaches that can be relieved by medicines approved by your health care provider.  · You may urinate more often. Painful urination may mean you have a bladder infection.  · You may develop heartburn as a result of your pregnancy.  · You may develop constipation because certain hormones are causing the muscles that push waste through your intestines to slow down.  · You may develop hemorrhoids or swollen, bulging veins (varicose veins).  · Your breasts may begin to grow larger and become tender. Your nipples may stick out more, and the tissue that surrounds them (areola) may become darker.  · Your gums may bleed and may be sensitive to brushing and flossing.   · Dark spots or blotches (chloasma, mask of pregnancy) may develop on your face. This will likely fade after the baby is born.  · Your menstrual periods will stop.  · You may have a loss of appetite.  · You may develop cravings for certain kinds of food.  · You may have changes in your emotions from day to day, such as being excited to be pregnant or being concerned that something may go wrong with the pregnancy and baby.  · You may have more vivid and strange dreams.  · You may have changes in your hair. These can include thickening of your hair, rapid growth, and changes in texture. Some women also have hair loss during or after pregnancy, or hair that feels dry or thin. Your hair will most likely return to normal after your baby is born.  WHAT TO EXPECT AT YOUR PRENATAL VISITS  During a routine prenatal visit:  · You will be weighed to make sure you and the baby are growing normally.  · Your blood pressure will be taken.  · Your abdomen will be measured to track your baby's growth.  · The fetal heartbeat will be listened to starting around week 10 or 12 of your pregnancy.  · Test results from any previous visits will be discussed.  Your health care provider may ask you:  · How you are feeling.  · If you   are feeling the baby move.  · If you have had any abnormal symptoms, such as leaking fluid, bleeding, severe headaches, or abdominal cramping.  · If you are using any tobacco products, including cigarettes, chewing tobacco, and electronic cigarettes.  · If you have any questions.  Other tests that may be performed during your first trimester include:  · Blood tests to find your blood type and to check for the presence of any previous infections. They will also be used to check for low iron levels (anemia) and Rh antibodies. Later in the pregnancy, blood tests for diabetes will be done along with other tests if problems develop.  · Urine tests to check for infections, diabetes, or protein in the urine.   · An ultrasound to confirm the proper growth and development of the baby.  · An amniocentesis to check for possible genetic problems.  · Fetal screens for spina bifida and Down syndrome.  · You may need other tests to make sure you and the baby are doing well.  · HIV (human immunodeficiency virus) testing. Routine prenatal testing includes screening for HIV, unless you choose not to have this test.  HOME CARE INSTRUCTIONS   Medicines  · Follow your health care provider's instructions regarding medicine use. Specific medicines may be either safe or unsafe to take during pregnancy.  · Take your prenatal vitamins as directed.  · If you develop constipation, try taking a stool softener if your health care provider approves.  Diet  · Eat regular, well-balanced meals. Choose a variety of foods, such as meat or vegetable-based protein, fish, milk and low-fat dairy products, vegetables, fruits, and whole grain breads and cereals. Your health care provider will help you determine the amount of weight gain that is right for you.  · Avoid raw meat and uncooked cheese. These carry germs that can cause birth defects in the baby.  · Eating four or five small meals rather than three large meals a day may help relieve nausea and vomiting. If you start to feel nauseous, eating a few soda crackers can be helpful. Drinking liquids between meals instead of during meals also seems to help nausea and vomiting.  · If you develop constipation, eat more high-fiber foods, such as fresh vegetables or fruit and whole grains. Drink enough fluids to keep your urine clear or pale yellow.  Activity and Exercise  · Exercise only as directed by your health care provider. Exercising will help you:    Control your weight.    Stay in shape.    Be prepared for labor and delivery.  · Experiencing pain or cramping in the lower abdomen or low back is a good sign that you should stop exercising. Check with your health care provider  before continuing normal exercises.  · Try to avoid standing for long periods of time. Move your legs often if you must stand in one place for a long time.  · Avoid heavy lifting.  · Wear low-heeled shoes, and practice good posture.  · You may continue to have sex unless your health care provider directs you otherwise.  Relief of Pain or Discomfort  · Wear a good support bra for breast tenderness.    · Take warm sitz baths to soothe any pain or discomfort caused by hemorrhoids. Use hemorrhoid cream if your health care provider approves.    · Rest with your legs elevated if you have leg cramps or low back pain.  · If you develop varicose veins in your   legs, wear support hose. Elevate your feet for 15 minutes, 3-4 times a day. Limit salt in your diet.  Prenatal Care  · Schedule your prenatal visits by the twelfth week of pregnancy. They are usually scheduled monthly at first, then more often in the last 2 months before delivery.  · Write down your questions. Take them to your prenatal visits.  · Keep all your prenatal visits as directed by your health care provider.  Safety  · Wear your seat belt at all times when driving.  · Make a list of emergency phone numbers, including numbers for family, friends, the hospital, and police and fire departments.  General Tips  · Ask your health care provider for a referral to a local prenatal education class. Begin classes no later than at the beginning of month 6 of your pregnancy.  · Ask for help if you have counseling or nutritional needs during pregnancy. Your health care provider can offer advice or refer you to specialists for help with various needs.  · Do not use hot tubs, steam rooms, or saunas.  · Do not douche or use tampons or scented sanitary pads.  · Do not cross your legs for long periods of time.  · Avoid cat litter boxes and soil used by cats. These carry germs that can cause birth defects in the baby and possibly loss of the fetus by miscarriage or stillbirth.   · Avoid all smoking, herbs, alcohol, and medicines not prescribed by your health care provider. Chemicals in these affect the formation and growth of the baby.  · Do not use any tobacco products, including cigarettes, chewing tobacco, and electronic cigarettes. If you need help quitting, ask your health care provider. You may receive counseling support and other resources to help you quit.  · Schedule a dentist appointment. At home, brush your teeth with a soft toothbrush and be gentle when you floss.  SEEK MEDICAL CARE IF:   · You have dizziness.  · You have mild pelvic cramps, pelvic pressure, or nagging pain in the abdominal area.  · You have persistent nausea, vomiting, or diarrhea.  · You have a bad smelling vaginal discharge.  · You have pain with urination.  · You notice increased swelling in your face, hands, legs, or ankles.  SEEK IMMEDIATE MEDICAL CARE IF:   · You have a fever.  · You are leaking fluid from your vagina.  · You have spotting or bleeding from your vagina.  · You have severe abdominal cramping or pain.  · You have rapid weight gain or loss.  · You vomit blood or material that looks like coffee grounds.  · You are exposed to German measles and have never had them.  · You are exposed to fifth disease or chickenpox.  · You develop a severe headache.  · You have shortness of breath.  · You have any kind of trauma, such as from a fall or a car accident.     This information is not intended to replace advice given to you by your health care provider. Make sure you discuss any questions you have with your health care provider.     Document Released: 04/28/2001 Document Revised: 05/25/2014 Document Reviewed: 03/14/2013  Elsevier Interactive Patient Education ©2017 [employer]

## 2016-05-21 NOTE — Progress Notes (Signed)
Presents with positive home pregnancy test, LMP 04/22/16, normal cycle. Feelings of mild low abdominal cramping, Bilateral breast tenderness. BMI 31.62 kg/m2. 01/2016 Blood glucose within normal limts . Last migraine, 03/2016, lasted five days. Migraines managed with Topamax and Imitirex. History of exercised induced asthma managed with Albuterol.  Currently taking Women's One Day multivitamin. Father has type 2 diabetes; Mother  with Multiple Sclerosis. No known familial genetic abnormalities. Partner sickle cell trait  Positive, pt reports being negative..  Exam: Well appearing; nervous about pregnancy. Positive urine HCG testing.   [redacted] weeks gestation per dates  Plan: Discussed safe practices during pregnancy.  Counseled concerning nutrition. Emphasized portion control, healthy diet, and moderate calorie increase (300 kcal).  Reviewed current medication.  Patient will discontinue Topamax (category D) and Imitirex (category C) during pregnancy. Encourage plain Tylenol for headaches. Discussed current exercise regiment and variation of intensity based on bodily responses. Will return after January 25 for viability/dating  ultrasound, aware we no longer deliver,will refer after viability US. Recommended bra whilel sleeping to minimize mastodynia.

## 2016-05-26 ENCOUNTER — Telehealth: Payer: Self-pay | Admitting: *Deleted

## 2016-05-26 NOTE — Telephone Encounter (Signed)
Pt called c/o pressure in hip area comes and goes, early pregnancy. I advised pt to take tylenol for discomfort.

## 2016-06-17 ENCOUNTER — Encounter: Payer: Self-pay | Admitting: Women's Health

## 2016-06-17 ENCOUNTER — Ambulatory Visit (INDEPENDENT_AMBULATORY_CARE_PROVIDER_SITE_OTHER): Payer: BLUE CROSS/BLUE SHIELD

## 2016-06-17 ENCOUNTER — Ambulatory Visit (INDEPENDENT_AMBULATORY_CARE_PROVIDER_SITE_OTHER): Payer: BLUE CROSS/BLUE SHIELD | Admitting: Women's Health

## 2016-06-17 VITALS — BP 128/76

## 2016-06-17 DIAGNOSIS — O3680X0 Pregnancy with inconclusive fetal viability, not applicable or unspecified: Secondary | ICD-10-CM

## 2016-06-17 DIAGNOSIS — O3680X1 Pregnancy with inconclusive fetal viability, fetus 1: Secondary | ICD-10-CM

## 2016-06-17 DIAGNOSIS — Z3201 Encounter for pregnancy test, result positive: Secondary | ICD-10-CM

## 2016-06-17 NOTE — Patient Instructions (Signed)
First Trimester of Pregnancy  The first trimester of pregnancy is from week 1 until the end of week 12 (months 1 through 3). A week after a sperm fertilizes an egg, the egg will implant on the wall of the uterus. This embryo will begin to develop into a baby. Genes from you and your partner are forming the baby. The female genes determine whether the baby is a boy or a girl. At 6-8 weeks, the eyes and face are formed, and the heartbeat can be seen on ultrasound. At the end of 12 weeks, all the baby's organs are formed.   Now that you are pregnant, you will want to do everything you can to have a healthy baby. Two of the most important things are to get good prenatal care and to follow your health care provider's instructions. Prenatal care is all the medical care you receive before the baby's birth. This care will help prevent, find, and treat any problems during the pregnancy and childbirth.  BODY CHANGES  Your body goes through many changes during pregnancy. The changes vary from woman to woman.   · You may gain or lose a couple of pounds at first.  · You may feel sick to your stomach (nauseous) and throw up (vomit). If the vomiting is uncontrollable, call your health care provider.  · You may tire easily.  · You may develop headaches that can be relieved by medicines approved by your health care provider.  · You may urinate more often. Painful urination may mean you have a bladder infection.  · You may develop heartburn as a result of your pregnancy.  · You may develop constipation because certain hormones are causing the muscles that push waste through your intestines to slow down.  · You may develop hemorrhoids or swollen, bulging veins (varicose veins).  · Your breasts may begin to grow larger and become tender. Your nipples may stick out more, and the tissue that surrounds them (areola) may become darker.  · Your gums may bleed and may be sensitive to brushing and flossing.   · Dark spots or blotches (chloasma, mask of pregnancy) may develop on your face. This will likely fade after the baby is born.  · Your menstrual periods will stop.  · You may have a loss of appetite.  · You may develop cravings for certain kinds of food.  · You may have changes in your emotions from day to day, such as being excited to be pregnant or being concerned that something may go wrong with the pregnancy and baby.  · You may have more vivid and strange dreams.  · You may have changes in your hair. These can include thickening of your hair, rapid growth, and changes in texture. Some women also have hair loss during or after pregnancy, or hair that feels dry or thin. Your hair will most likely return to normal after your baby is born.  WHAT TO EXPECT AT YOUR PRENATAL VISITS  During a routine prenatal visit:  · You will be weighed to make sure you and the baby are growing normally.  · Your blood pressure will be taken.  · Your abdomen will be measured to track your baby's growth.  · The fetal heartbeat will be listened to starting around week 10 or 12 of your pregnancy.  · Test results from any previous visits will be discussed.  Your health care provider may ask you:  · How you are feeling.  · If you   are feeling the baby move.  · If you have had any abnormal symptoms, such as leaking fluid, bleeding, severe headaches, or abdominal cramping.  · If you are using any tobacco products, including cigarettes, chewing tobacco, and electronic cigarettes.  · If you have any questions.  Other tests that may be performed during your first trimester include:  · Blood tests to find your blood type and to check for the presence of any previous infections. They will also be used to check for low iron levels (anemia) and Rh antibodies. Later in the pregnancy, blood tests for diabetes will be done along with other tests if problems develop.  · Urine tests to check for infections, diabetes, or protein in the urine.   · An ultrasound to confirm the proper growth and development of the baby.  · An amniocentesis to check for possible genetic problems.  · Fetal screens for spina bifida and Down syndrome.  · You may need other tests to make sure you and the baby are doing well.  · HIV (human immunodeficiency virus) testing. Routine prenatal testing includes screening for HIV, unless you choose not to have this test.  HOME CARE INSTRUCTIONS   Medicines  · Follow your health care provider's instructions regarding medicine use. Specific medicines may be either safe or unsafe to take during pregnancy.  · Take your prenatal vitamins as directed.  · If you develop constipation, try taking a stool softener if your health care provider approves.  Diet  · Eat regular, well-balanced meals. Choose a variety of foods, such as meat or vegetable-based protein, fish, milk and low-fat dairy products, vegetables, fruits, and whole grain breads and cereals. Your health care provider will help you determine the amount of weight gain that is right for you.  · Avoid raw meat and uncooked cheese. These carry germs that can cause birth defects in the baby.  · Eating four or five small meals rather than three large meals a day may help relieve nausea and vomiting. If you start to feel nauseous, eating a few soda crackers can be helpful. Drinking liquids between meals instead of during meals also seems to help nausea and vomiting.  · If you develop constipation, eat more high-fiber foods, such as fresh vegetables or fruit and whole grains. Drink enough fluids to keep your urine clear or pale yellow.  Activity and Exercise  · Exercise only as directed by your health care provider. Exercising will help you:    Control your weight.    Stay in shape.    Be prepared for labor and delivery.  · Experiencing pain or cramping in the lower abdomen or low back is a good sign that you should stop exercising. Check with your health care provider  before continuing normal exercises.  · Try to avoid standing for long periods of time. Move your legs often if you must stand in one place for a long time.  · Avoid heavy lifting.  · Wear low-heeled shoes, and practice good posture.  · You may continue to have sex unless your health care provider directs you otherwise.  Relief of Pain or Discomfort  · Wear a good support bra for breast tenderness.    · Take warm sitz baths to soothe any pain or discomfort caused by hemorrhoids. Use hemorrhoid cream if your health care provider approves.    · Rest with your legs elevated if you have leg cramps or low back pain.  · If you develop varicose veins in your   legs, wear support hose. Elevate your feet for 15 minutes, 3-4 times a day. Limit salt in your diet.  Prenatal Care  · Schedule your prenatal visits by the twelfth week of pregnancy. They are usually scheduled monthly at first, then more often in the last 2 months before delivery.  · Write down your questions. Take them to your prenatal visits.  · Keep all your prenatal visits as directed by your health care provider.  Safety  · Wear your seat belt at all times when driving.  · Make a list of emergency phone numbers, including numbers for family, friends, the hospital, and police and fire departments.  General Tips  · Ask your health care provider for a referral to a local prenatal education class. Begin classes no later than at the beginning of month 6 of your pregnancy.  · Ask for help if you have counseling or nutritional needs during pregnancy. Your health care provider can offer advice or refer you to specialists for help with various needs.  · Do not use hot tubs, steam rooms, or saunas.  · Do not douche or use tampons or scented sanitary pads.  · Do not cross your legs for long periods of time.  · Avoid cat litter boxes and soil used by cats. These carry germs that can cause birth defects in the baby and possibly loss of the fetus by miscarriage or stillbirth.   · Avoid all smoking, herbs, alcohol, and medicines not prescribed by your health care provider. Chemicals in these affect the formation and growth of the baby.  · Do not use any tobacco products, including cigarettes, chewing tobacco, and electronic cigarettes. If you need help quitting, ask your health care provider. You may receive counseling support and other resources to help you quit.  · Schedule a dentist appointment. At home, brush your teeth with a soft toothbrush and be gentle when you floss.  SEEK MEDICAL CARE IF:   · You have dizziness.  · You have mild pelvic cramps, pelvic pressure, or nagging pain in the abdominal area.  · You have persistent nausea, vomiting, or diarrhea.  · You have a bad smelling vaginal discharge.  · You have pain with urination.  · You notice increased swelling in your face, hands, legs, or ankles.  SEEK IMMEDIATE MEDICAL CARE IF:   · You have a fever.  · You are leaking fluid from your vagina.  · You have spotting or bleeding from your vagina.  · You have severe abdominal cramping or pain.  · You have rapid weight gain or loss.  · You vomit blood or material that looks like coffee grounds.  · You are exposed to German measles and have never had them.  · You are exposed to fifth disease or chickenpox.  · You develop a severe headache.  · You have shortness of breath.  · You have any kind of trauma, such as from a fall or a car accident.     This information is not intended to replace advice given to you by your health care provider. Make sure you discuss any questions you have with your health care provider.     Document Released: 04/28/2001 Document Revised: 05/25/2014 Document Reviewed: 03/14/2013  Elsevier Interactive Patient Education ©2017 [employer]

## 2016-06-17 NOTE — Progress Notes (Signed)
Presents for viability ultrasound. LMP 04/22/2016. Taking prenatal vitamin daily. Healthy lifestyle. Only complaint is extreme fatigue. Denies abdominal pain, discharge, urinary symptoms, bleeding.  Exam: Appears well. Ultrasound: T/V and T/A anteverted uterus with IUP seen in fundus. Size less than dates by 2 days. LMP EGA [redacted] weeks, CRL7 w 5 d. Fetal pole and fetal heart motion 153 bpm seen. Right ovary normal. Left ovary corpus luteal cyst 22 x 27 mm. Fluid in cul-de-sac 47 x 12 mm. Cervix long and closed. Yolk sac normal size and shape.  First trimester pregnancy  Plan: Normality of ultrasound reviewed, copy of ultrasound given instructed to schedule new OB appointment and take copy of ultrasound to appointment. Safe pregnancy behaviors reviewed. Continue prenatal vitamin daily. Congratulations given.

## 2016-06-24 DIAGNOSIS — Z3491 Encounter for supervision of normal pregnancy, unspecified, first trimester: Secondary | ICD-10-CM | POA: Diagnosis not present

## 2016-06-24 LAB — OB RESULTS CONSOLE RPR: RPR: NONREACTIVE

## 2016-06-24 LAB — OB RESULTS CONSOLE HIV ANTIBODY (ROUTINE TESTING): HIV: NONREACTIVE

## 2016-06-24 LAB — OB RESULTS CONSOLE ABO/RH: RH TYPE: POSITIVE

## 2016-06-24 LAB — OB RESULTS CONSOLE RUBELLA ANTIBODY, IGM: RUBELLA: IMMUNE

## 2016-06-24 LAB — OB RESULTS CONSOLE HEPATITIS B SURFACE ANTIGEN: Hepatitis B Surface Ag: NEGATIVE

## 2016-06-24 LAB — OB RESULTS CONSOLE GC/CHLAMYDIA
Chlamydia: NEGATIVE
Gonorrhea: NEGATIVE

## 2016-06-24 LAB — OB RESULTS CONSOLE ANTIBODY SCREEN: Antibody Screen: NEGATIVE

## 2016-06-29 DIAGNOSIS — Z3491 Encounter for supervision of normal pregnancy, unspecified, first trimester: Secondary | ICD-10-CM | POA: Diagnosis not present

## 2016-06-29 DIAGNOSIS — Z3A09 9 weeks gestation of pregnancy: Secondary | ICD-10-CM | POA: Diagnosis not present

## 2016-07-27 DIAGNOSIS — Z3491 Encounter for supervision of normal pregnancy, unspecified, first trimester: Secondary | ICD-10-CM | POA: Diagnosis not present

## 2016-07-27 DIAGNOSIS — Z3A13 13 weeks gestation of pregnancy: Secondary | ICD-10-CM | POA: Diagnosis not present

## 2016-08-25 DIAGNOSIS — M545 Low back pain: Secondary | ICD-10-CM | POA: Diagnosis not present

## 2016-08-25 DIAGNOSIS — Z369 Encounter for antenatal screening, unspecified: Secondary | ICD-10-CM | POA: Diagnosis not present

## 2016-08-25 DIAGNOSIS — Z3A29 29 weeks gestation of pregnancy: Secondary | ICD-10-CM | POA: Diagnosis not present

## 2016-09-04 DIAGNOSIS — Z3492 Encounter for supervision of normal pregnancy, unspecified, second trimester: Secondary | ICD-10-CM | POA: Diagnosis not present

## 2016-09-04 DIAGNOSIS — Z3A19 19 weeks gestation of pregnancy: Secondary | ICD-10-CM | POA: Diagnosis not present

## 2016-09-04 DIAGNOSIS — Z3689 Encounter for other specified antenatal screening: Secondary | ICD-10-CM | POA: Diagnosis not present

## 2016-09-21 DIAGNOSIS — F4323 Adjustment disorder with mixed anxiety and depressed mood: Secondary | ICD-10-CM | POA: Diagnosis not present

## 2016-09-30 ENCOUNTER — Encounter: Payer: Self-pay | Admitting: Gynecology

## 2016-10-01 DIAGNOSIS — Z3492 Encounter for supervision of normal pregnancy, unspecified, second trimester: Secondary | ICD-10-CM | POA: Diagnosis not present

## 2016-10-01 DIAGNOSIS — Z3A23 23 weeks gestation of pregnancy: Secondary | ICD-10-CM | POA: Diagnosis not present

## 2016-10-09 ENCOUNTER — Inpatient Hospital Stay (HOSPITAL_COMMUNITY)
Admission: AD | Admit: 2016-10-09 | Discharge: 2016-10-09 | Disposition: A | Discharge status: Home / Self Care | Payer: BLUE CROSS/BLUE SHIELD | Source: Ambulatory Visit | Attending: Obstetrics and Gynecology | Admitting: Obstetrics and Gynecology

## 2016-10-09 ENCOUNTER — Encounter (HOSPITAL_COMMUNITY): Payer: Self-pay

## 2016-10-09 DIAGNOSIS — R109 Unspecified abdominal pain: Secondary | ICD-10-CM | POA: Insufficient documentation

## 2016-10-09 DIAGNOSIS — Z833 Family history of diabetes mellitus: Secondary | ICD-10-CM

## 2016-10-09 DIAGNOSIS — O26892 Other specified pregnancy related conditions, second trimester: Secondary | ICD-10-CM

## 2016-10-09 DIAGNOSIS — Z832 Family history of diseases of the blood and blood-forming organs and certain disorders involving the immune mechanism: Secondary | ICD-10-CM

## 2016-10-09 DIAGNOSIS — O26899 Other specified pregnancy related conditions, unspecified trimester: Secondary | ICD-10-CM

## 2016-10-09 DIAGNOSIS — Z3A24 24 weeks gestation of pregnancy: Secondary | ICD-10-CM | POA: Insufficient documentation

## 2016-10-09 DIAGNOSIS — Z8249 Family history of ischemic heart disease and other diseases of the circulatory system: Secondary | ICD-10-CM

## 2016-10-09 LAB — WET PREP, GENITAL
Clue Cells Wet Prep HPF POC: NONE SEEN
Sperm: NONE SEEN
Trich, Wet Prep: NONE SEEN
Yeast Wet Prep HPF POC: NONE SEEN

## 2016-10-09 LAB — URINALYSIS, ROUTINE W REFLEX MICROSCOPIC
Bilirubin Urine: NEGATIVE
GLUCOSE, UA: NEGATIVE mg/dL
HGB URINE DIPSTICK: NEGATIVE
KETONES UR: NEGATIVE mg/dL
Leukocytes, UA: NEGATIVE
Nitrite: NEGATIVE
PH: 7 (ref 5.0–8.0)
PROTEIN: NEGATIVE mg/dL
Specific Gravity, Urine: 1.006 (ref 1.005–1.030)

## 2016-10-09 NOTE — MAU Note (Signed)
Patient presented after calling office with report of mid-abdominal pain x 45 min, now resolved.  Declines to be seen in MAU.  Denies N/V, dysuria, bleeding, leaking, or any other sx.  Noting good FM. Vitals:   10/09/16 1402  BP: (!) 149/73  Pulse: 83  Resp: 18  Temp: 97.4 F (36.3 C)  TempSrc: Oral  SpO2: 100%  Weight: 97.6 kg (215 lb 1.9 oz)   FHR 141.  UA  Results for orders placed or performed during the hospital encounter of 10/09/16 (from the past 24 hour(s))  Urinalysis, Routine w reflex microscopic     Status: Abnormal   Collection Time: 10/09/16  1:55 PM  Result Value Ref Range   Color, Urine STRAW (A) YELLOW   APPearance CLEAR CLEAR   Specific Gravity, Urine 1.006 1.005 - 1.030   pH 7.0 5.0 - 8.0   Glucose, UA NEGATIVE NEGATIVE mg/dL   Hgb urine dipstick NEGATIVE NEGATIVE   Bilirubin Urine NEGATIVE NEGATIVE   Ketones, ur NEGATIVE NEGATIVE mg/dL   Protein, ur NEGATIVE NEGATIVE mg/dL   Nitrite NEGATIVE NEGATIVE   Leukocytes, UA NEGATIVE NEGATIVE   Advised patient to return if any resumption of pain.  Nigel BridgemanVicki Naela Nodal, CNM 10/09/16 2:45p    Advised patient I could not force her to stay, but further evaluation would be the only way to determine status of cervix, presence of contractions, or other issues.

## 2016-10-09 NOTE — MAU Note (Signed)
Patient returned to [facility] for evaluation of abdominal pain.

## 2016-10-09 NOTE — MAU Note (Signed)
+  abdominal pain --states starting lower mid abdomen and radiating upward--sharp in nature and comes and goes Pain started 45 minutes ago. Has not taken anything for the pain.   Denies LOF, VB, or discharge. +FM

## 2016-10-09 NOTE — Discharge Instructions (Signed)
Abdominal Pain During Pregnancy  Abdominal pain is common in pregnancy. Most of the time, it does not cause harm. There are many causes of abdominal pain. Some causes are more serious than others and sometimes the cause is not known. Abdominal pain can be a sign that something is very wrong with the pregnancy or the pain may have nothing to do with the pregnancy. Always tell your health care provider if you have any abdominal pain.  Follow these instructions at home:  · Do not have sex or put anything in your vagina until your symptoms go away completely.  · Watch your abdominal pain for any changes.  · Get plenty of rest until your pain improves.  · Drink enough fluid to keep your urine clear or pale yellow.  · Take over-the-counter or prescription medicines only as told by your health care provider.  · Keep all follow-up visits as told by your health care provider. This is important.  Contact a health care provider if:  · You have a fever.  · Your pain gets worse or you have cramping.  · Your pain continues after resting.  Get help right away if:  · You are bleeding, leaking fluid, or passing tissue from the vagina.  · You have vomiting or diarrhea that does not go away.  · You have painful or bloody urination.  · You notice a decrease in your baby's movements.  · You feel very weak or faint.  · You have shortness of breath.  · You develop a severe headache with abdominal pain.  · You have abnormal vaginal discharge with abdominal pain.  This information is not intended to replace advice given to you by your health care provider. Make sure you discuss any questions you have with your health care provider.  Document Released: 05/04/2005 Document Revised: 02/13/2016 Document Reviewed: 12/01/2012  Elsevier Interactive Patient Education © 2017 [employer]

## 2016-10-09 NOTE — MAU Note (Signed)
  History   31 yo G1P0 at 24 2/7 weeks presented unannounced to MAU (after presenting earlier and leaving without being seen) with single episode of abdominal pain today at approx 1pm, lasting about 45 min--occurred while sitting at work, was sharp/tight pain in lower abdomen, extending up to breasts.  Denies N/V, diarrhea, leaking, bleeding, reports +FM.  Denies reflux.  Ate pizza for breakfast, no lunch.    Patient Active Problem List   Diagnosis Date Noted  . Family history of sickle cell trait 10/09/2016  . Family history of diabetes mellitus (DM) 10/09/2016  . Family history of hypertension 10/09/2016    Chief Complaint  Patient presents with  . Abdominal Pain   HPI:  As above  OB History    Gravida Para Term Preterm AB Living   1       0 0   SAB TAB Ectopic Multiple Live Births       0          Past Medical History:  Diagnosis Date  . Anemia   . Asthma   . Headache     Past Surgical History:  Procedure Laterality Date  . TONSILLECTOMY AND ADENOIDECTOMY      Family History  Problem Relation Age of Onset  . Hypertension Father   . Diabetes Father   . Hypertension Paternal Grandmother   . Diabetes Paternal Grandmother   . Hypertension Paternal Grandfather   . Diabetes Paternal Grandfather   . Diabetes Maternal Grandmother   . Diabetes Maternal Grandfather   . Multiple sclerosis Mother     Social History  Substance Use Topics  . Smoking status: Never Smoker  . Smokeless tobacco: Never Used  . Alcohol use No    Allergies:  Allergies  Allergen Reactions  . Latex Itching    Itching with any use of latex    Prescriptions Prior to Admission  Medication Sig Dispense Refill Last Dose  . albuterol (PROVENTIL HFA;VENTOLIN HFA) 108 (90 Base) MCG/ACT inhaler Inhale 2 puffs into the lungs every 6 (six) hours as needed for wheezing or shortness of breath. 1 Inhaler 2 Taking  . Multiple Vitamins-Minerals (ONE-A-DAY FOR HER VITACRAVES) CHEW Chew by mouth.   Taking     ROS: Single episode of abdominal pain Physical Exam   Blood pressure 140/66, pulse 87, temperature 98.2 F (36.8 C), temperature source Oral, resp. rate 18, last menstrual period 04/22/2016, SpO2 99 %.    Physical Exam  In NAD Chest clear Heart RRR without murmur Abd gravid, mild tenderness in right mid-quadrant. Pelvic--cervix closed, long Ext WNL  FHR Reassuring for EGA, much FM UCs none  Wet prep done.  ED Course  Assessment: IUP at 24 2/7 weeks Single episode of abdominal pain  Plan: Observe at present on monitor.   Nigel BridgemanLATHAM, Tephanie Escorcia CNM, MSN 10/09/2016 4:06 PM   Addendum:  Vitals:   10/09/16 1545 10/09/16 1609  BP: 140/66 (!) 122/59  Pulse: 87   Resp: 18   Temp: 98.2 F (36.8 C)   TempSrc: Oral   SpO2: 99%    1 mild UC on monitor x 1 hour--patient unaware of that UC. No further episodes of pain since original occurrence at 1pm. Wet prep negative.  D/c home with usual precautions. Keep scheduled appt in 2 weeks, call prn.  Nigel BridgemanVicki Emonni Depasquale, CNM 10/09/16 5p

## 2016-10-09 NOTE — MAU Note (Signed)
Urine sent to lab 

## 2016-10-13 ENCOUNTER — Ambulatory Visit: Payer: BLUE CROSS/BLUE SHIELD | Admitting: Neurology

## 2016-10-14 ENCOUNTER — Inpatient Hospital Stay (HOSPITAL_COMMUNITY)
Admission: AD | Admit: 2016-10-14 | Payer: BLUE CROSS/BLUE SHIELD | Source: Ambulatory Visit | Admitting: Obstetrics and Gynecology

## 2016-10-19 ENCOUNTER — Encounter: Payer: Self-pay | Admitting: Neurology

## 2016-10-26 DIAGNOSIS — Z23 Encounter for immunization: Secondary | ICD-10-CM | POA: Diagnosis not present

## 2016-10-26 DIAGNOSIS — Z369 Encounter for antenatal screening, unspecified: Secondary | ICD-10-CM | POA: Diagnosis not present

## 2016-10-31 DIAGNOSIS — O99810 Abnormal glucose complicating pregnancy: Secondary | ICD-10-CM | POA: Diagnosis not present

## 2016-11-03 DIAGNOSIS — Z3483 Encounter for supervision of other normal pregnancy, third trimester: Secondary | ICD-10-CM | POA: Diagnosis not present

## 2016-11-03 DIAGNOSIS — Z3482 Encounter for supervision of other normal pregnancy, second trimester: Secondary | ICD-10-CM | POA: Diagnosis not present

## 2016-11-11 ENCOUNTER — Encounter: Payer: BLUE CROSS/BLUE SHIELD | Attending: Obstetrics and Gynecology | Admitting: Registered"

## 2016-11-11 DIAGNOSIS — O24419 Gestational diabetes mellitus in pregnancy, unspecified control: Secondary | ICD-10-CM | POA: Diagnosis not present

## 2016-11-11 DIAGNOSIS — Z3A00 Weeks of gestation of pregnancy not specified: Secondary | ICD-10-CM | POA: Diagnosis not present

## 2016-11-11 DIAGNOSIS — R7309 Other abnormal glucose: Secondary | ICD-10-CM

## 2016-11-12 ENCOUNTER — Encounter: Payer: Self-pay | Admitting: Registered"

## 2016-11-12 DIAGNOSIS — Z3A29 29 weeks gestation of pregnancy: Secondary | ICD-10-CM | POA: Diagnosis not present

## 2016-11-12 DIAGNOSIS — O24419 Gestational diabetes mellitus in pregnancy, unspecified control: Secondary | ICD-10-CM | POA: Diagnosis not present

## 2016-11-12 NOTE — Progress Notes (Signed)
Patient was seen on 11/11/2016 for Gestational Diabetes self-management class at the [facility]. The following learning objectives were met by the patient during this course:   States the definition of Gestational Diabetes  States why dietary management is important in controlling blood glucose  Describes the effects each nutrient has on blood glucose levels  Demonstrates ability to create a balanced meal plan  Demonstrates carbohydrate counting   States when to check blood glucose levels  Demonstrates proper blood glucose monitoring techniques  States the effect of stress and exercise on blood glucose levels  States the importance of limiting caffeine and abstaining from alcohol and smoking  Blood glucose monitor given: Contour Next Lot # [id number] Exp: 2017-02-14 Blood glucose reading: 96  Patient instructed to monitor glucose levels: FBS: 60 - <95 1 hour: <140 2 hour: <120  Patient received handouts:  Nutrition Diabetes and Pregnancy  Carbohydrate Counting List  Patient will be seen for follow-up as needed.

## 2016-11-14 IMAGING — MR MR HEAD WO/W CM
7 of 12 series · 20 of 48 positions shown · IV contrast (multihance)
Comparison: None.

CLINICAL DATA: Family history of multiple sclerosis. Chronic
migraine headaches with ORIF. Paresthesias.

EXAM:
MRI HEAD WITHOUT AND WITH CONTRAST
TECHNIQUE: Multiplanar, multiecho pulse sequences of the brain and surrounding
structures were obtained without and with intravenous contrast.
CONTRAST:  16mL MULTIHANCE GADOBENATE DIMEGLUMINE 529 MG/ML IV SOLN

[Series 3: T1 · sagittal · 5.0mm · 0.47mm/px · 2 of 23 slices shown]
[im 1/23]
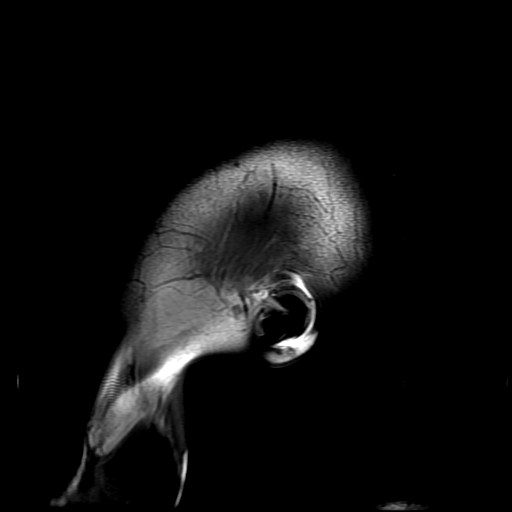
[im 12/23]
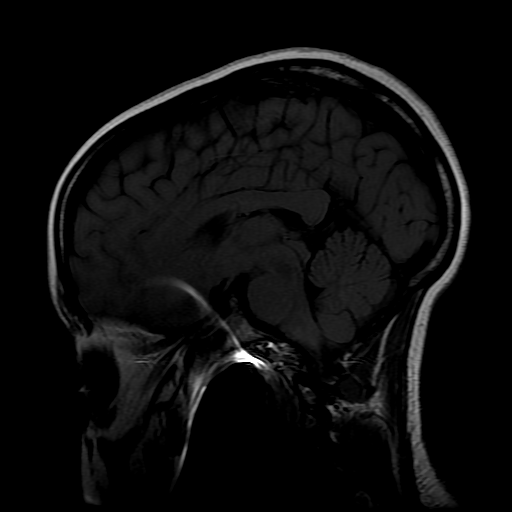

[Series 4: T2 · axial · 5.0mm · 0.43mm/px · z∈[-53,+84]mm · 2 of 24 slices shown]
[im 1/24]
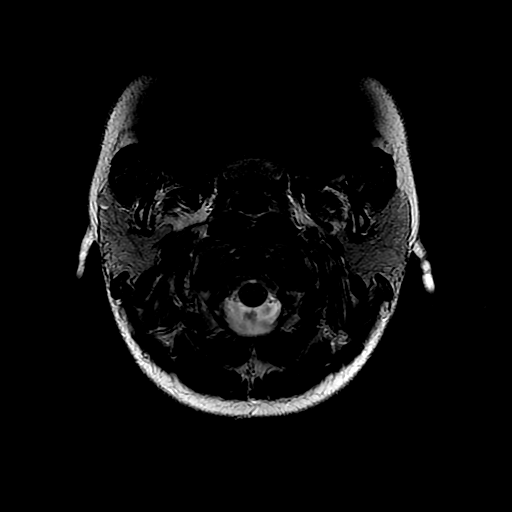
[im 24/24]
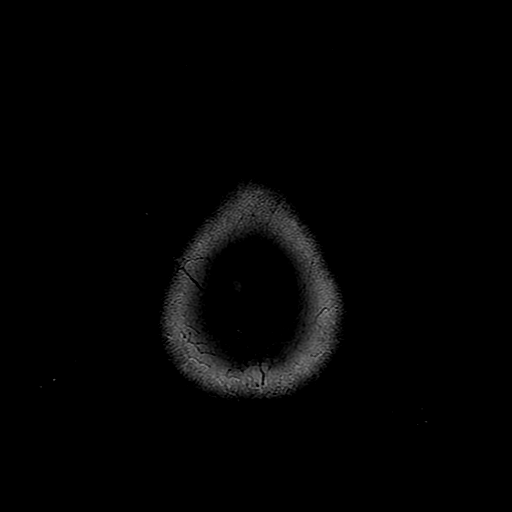

[Series 5: FLAIR · axial · 5.0mm · 0.43mm/px · z∈[-53,+84]mm · 2 of 24 slices shown (1 of 2)]
[im 1/24]
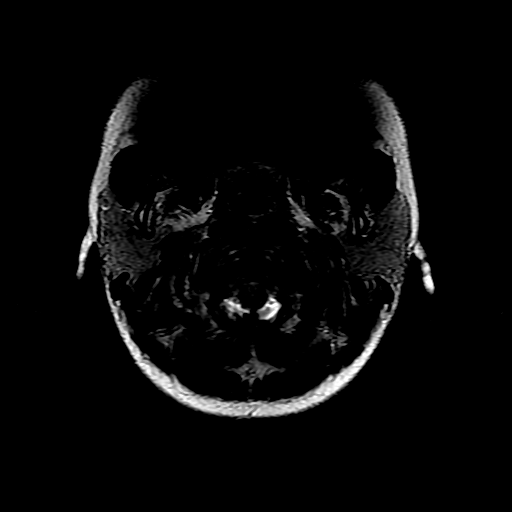
[im 24/24]
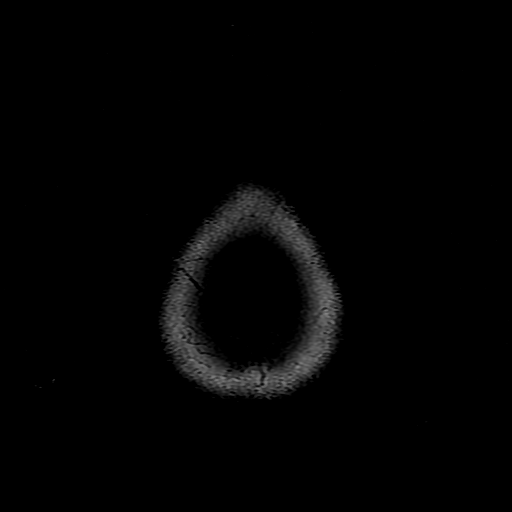

[Series 6: FLAIR · sagittal · 1.2mm · 0.49mm/px · 9 of 248 slices shown (2 of 2)]
[im 14/248]
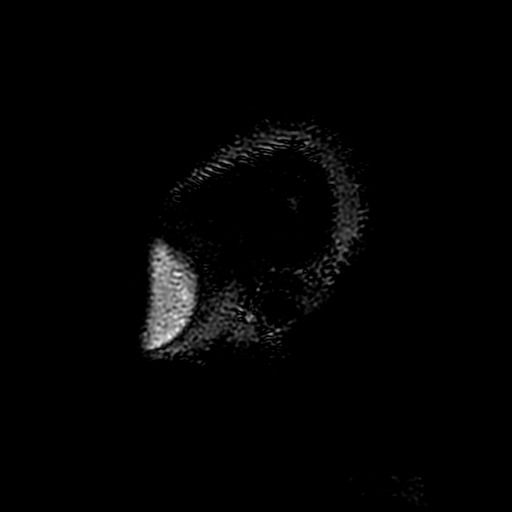
[im 42/248]
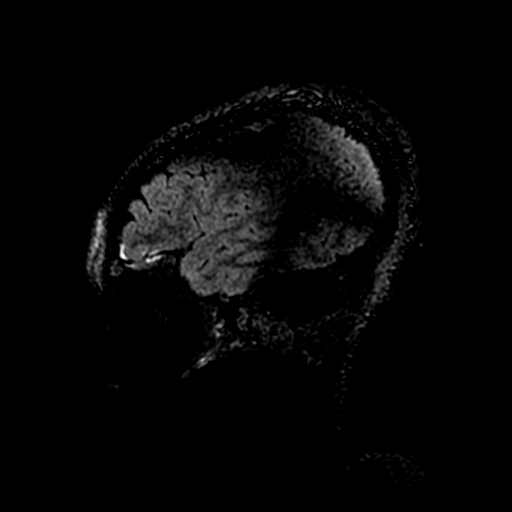
[im 69/248]
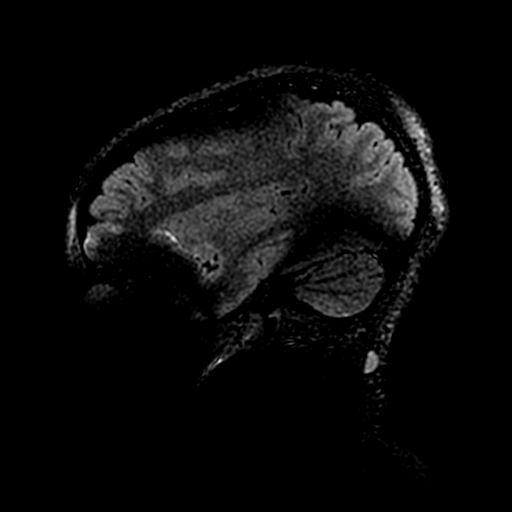
[im 110/248]
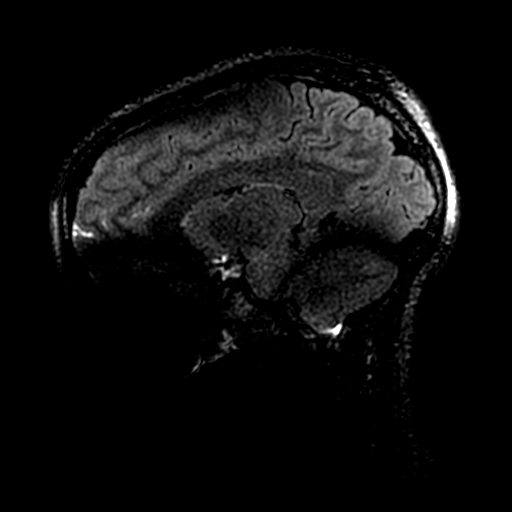
[im 124/248]
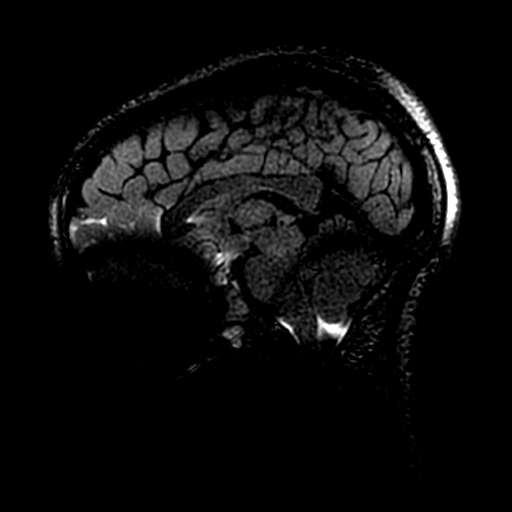
[im 138/248]
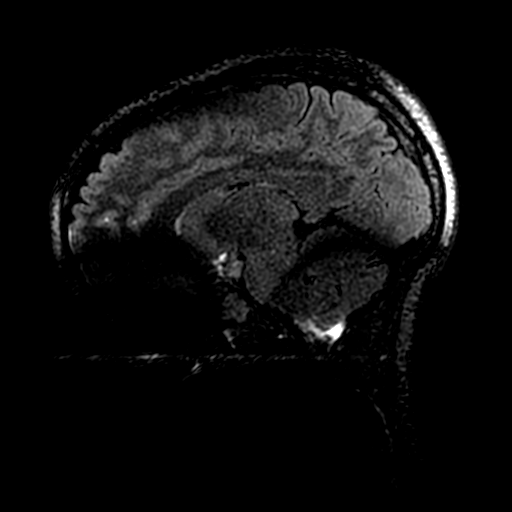
[im 179/248]
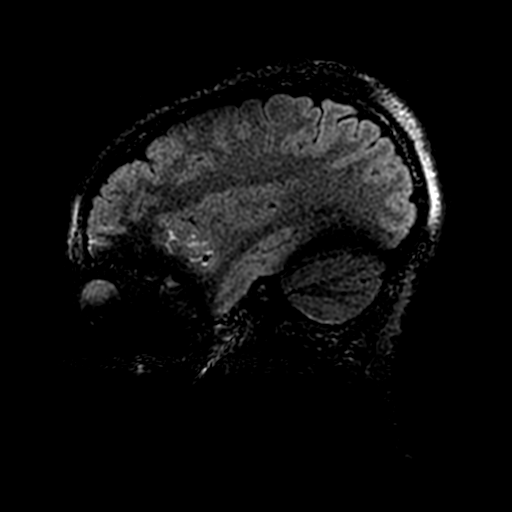
[im 206/248]
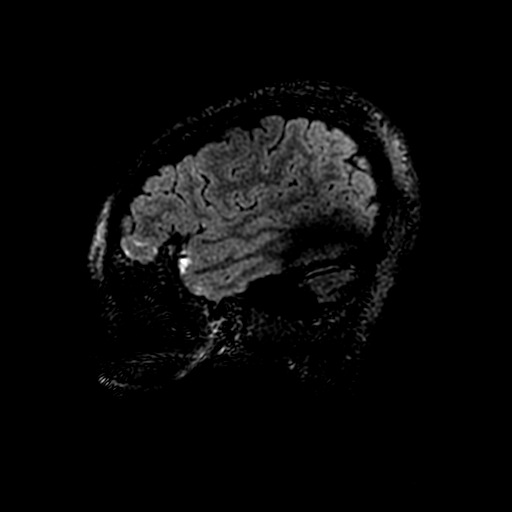
[im 234/248]
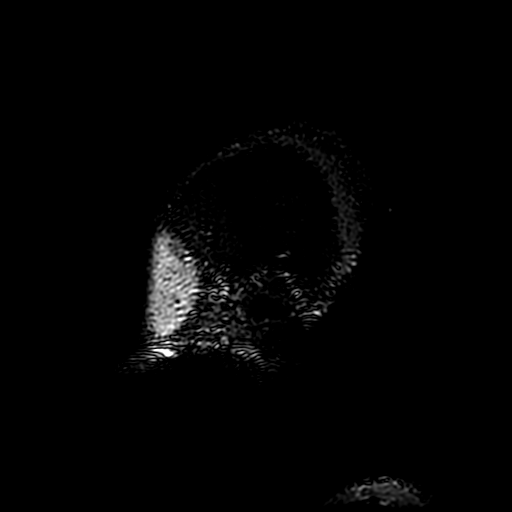

[Series 13: T2 post-contrast · coronal · 5.0mm · 0.39mm/px · 2 of 25 slices shown]
[im 1/25]
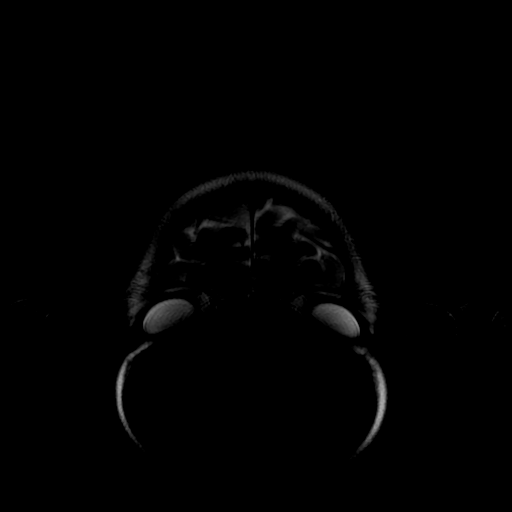
[im 25/25]
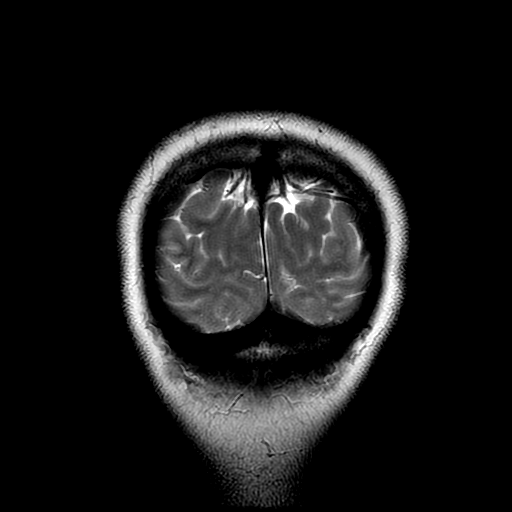

[Series 15: T1 post-contrast · coronal · 5.0mm · 0.39mm/px · 2 of 25 slices shown]
[im 1/25]
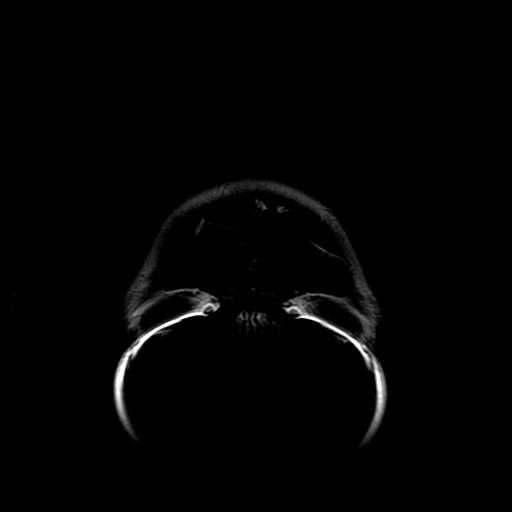
[im 25/25]
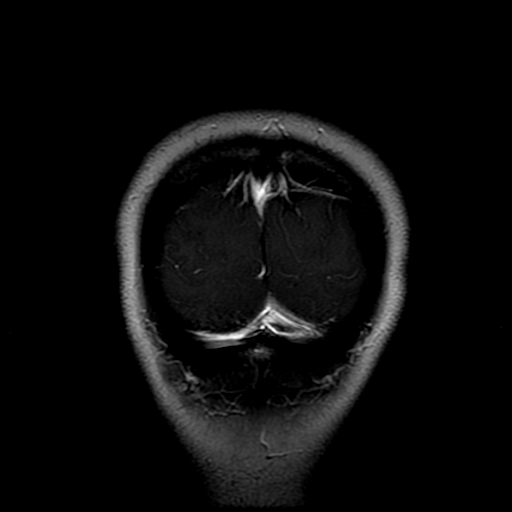

[Series 1100: ADC · axial · 5.0mm · 0.86mm/px · 1 of 19 slices shown]
[im 1/19]
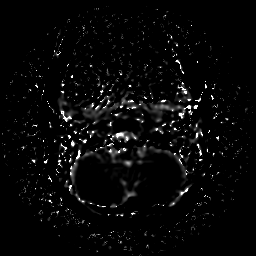

[20 of 48 positions shown; findings below may reference images not displayed]

FINDINGS: Study is mildly degraded by dental artifact. Accounting for this
artifact, no significant white matter lesions are evident. The
corpus callosum is within normal limits. No acute infarct,
hemorrhage, or mass lesion is present. The ventricles are of normal
size. No significant extraaxial fluid collection is present.

Flow is present in the major intracranial arteries. The globes are
obscured. Paranasal sinuses are obscured. The mastoid air cells are
clear.

The postcontrast images demonstrate no pathologic enhancement. The
skullbase is within normal limits.
IMPRESSION: 1. Negative MRI of the brain. No evidence for significant white
matter disease.
2. There is some distortion by significant metal artifact in the
mouth.

## 2016-11-23 DIAGNOSIS — Z9189 Other specified personal risk factors, not elsewhere classified: Secondary | ICD-10-CM | POA: Diagnosis not present

## 2016-11-23 DIAGNOSIS — O24419 Gestational diabetes mellitus in pregnancy, unspecified control: Secondary | ICD-10-CM | POA: Diagnosis not present

## 2016-11-23 DIAGNOSIS — Z3A30 30 weeks gestation of pregnancy: Secondary | ICD-10-CM | POA: Diagnosis not present

## 2016-12-07 DIAGNOSIS — Z3A32 32 weeks gestation of pregnancy: Secondary | ICD-10-CM | POA: Diagnosis not present

## 2016-12-07 DIAGNOSIS — Z3A00 Weeks of gestation of pregnancy not specified: Secondary | ICD-10-CM | POA: Diagnosis not present

## 2016-12-07 DIAGNOSIS — O24419 Gestational diabetes mellitus in pregnancy, unspecified control: Secondary | ICD-10-CM | POA: Diagnosis not present

## 2016-12-09 DIAGNOSIS — Z3A33 33 weeks gestation of pregnancy: Secondary | ICD-10-CM | POA: Diagnosis not present

## 2016-12-21 DIAGNOSIS — N898 Other specified noninflammatory disorders of vagina: Secondary | ICD-10-CM | POA: Diagnosis not present

## 2016-12-21 DIAGNOSIS — Z3A34 34 weeks gestation of pregnancy: Secondary | ICD-10-CM | POA: Diagnosis not present

## 2016-12-21 DIAGNOSIS — Z3493 Encounter for supervision of normal pregnancy, unspecified, third trimester: Secondary | ICD-10-CM | POA: Diagnosis not present

## 2016-12-28 ENCOUNTER — Encounter (HOSPITAL_COMMUNITY): Payer: Self-pay | Admitting: *Deleted

## 2016-12-28 ENCOUNTER — Inpatient Hospital Stay (HOSPITAL_COMMUNITY)
Admission: AD | Admit: 2016-12-28 | Discharge: 2016-12-28 | Disposition: A | Discharge status: Home / Self Care | Payer: BLUE CROSS/BLUE SHIELD | Source: Ambulatory Visit | Attending: Obstetrics and Gynecology | Admitting: Obstetrics and Gynecology

## 2016-12-28 DIAGNOSIS — O1213 Gestational proteinuria, third trimester: Secondary | ICD-10-CM | POA: Insufficient documentation

## 2016-12-28 DIAGNOSIS — Z8249 Family history of ischemic heart disease and other diseases of the circulatory system: Secondary | ICD-10-CM | POA: Diagnosis not present

## 2016-12-28 DIAGNOSIS — Z3A35 35 weeks gestation of pregnancy: Secondary | ICD-10-CM | POA: Insufficient documentation

## 2016-12-28 DIAGNOSIS — Z91040 Latex allergy status: Secondary | ICD-10-CM | POA: Insufficient documentation

## 2016-12-28 DIAGNOSIS — Z9889 Other specified postprocedural states: Secondary | ICD-10-CM | POA: Diagnosis not present

## 2016-12-28 DIAGNOSIS — Z8269 Family history of other diseases of the musculoskeletal system and connective tissue: Secondary | ICD-10-CM | POA: Insufficient documentation

## 2016-12-28 DIAGNOSIS — O10913 Unspecified pre-existing hypertension complicating pregnancy, third trimester: Secondary | ICD-10-CM | POA: Insufficient documentation

## 2016-12-28 DIAGNOSIS — R809 Proteinuria, unspecified: Secondary | ICD-10-CM | POA: Diagnosis not present

## 2016-12-28 DIAGNOSIS — R51 Headache: Secondary | ICD-10-CM | POA: Insufficient documentation

## 2016-12-28 DIAGNOSIS — O99013 Anemia complicating pregnancy, third trimester: Secondary | ICD-10-CM | POA: Insufficient documentation

## 2016-12-28 DIAGNOSIS — O9953 Diseases of the respiratory system complicating the puerperium: Secondary | ICD-10-CM | POA: Insufficient documentation

## 2016-12-28 DIAGNOSIS — J45909 Unspecified asthma, uncomplicated: Secondary | ICD-10-CM | POA: Insufficient documentation

## 2016-12-28 DIAGNOSIS — Z833 Family history of diabetes mellitus: Secondary | ICD-10-CM | POA: Insufficient documentation

## 2016-12-28 DIAGNOSIS — O26893 Other specified pregnancy related conditions, third trimester: Secondary | ICD-10-CM

## 2016-12-28 LAB — URINALYSIS, ROUTINE W REFLEX MICROSCOPIC
Bilirubin Urine: NEGATIVE
Glucose, UA: NEGATIVE mg/dL
Ketones, ur: NEGATIVE mg/dL
Leukocytes, UA: NEGATIVE
Nitrite: NEGATIVE
Protein, ur: NEGATIVE mg/dL
Specific Gravity, Urine: 1.008 (ref 1.005–1.030)
pH: 6 (ref 5.0–8.0)

## 2016-12-28 LAB — CBC
HCT: 36 % (ref 36.0–46.0)
HEMOGLOBIN: 11.9 g/dL — AB (ref 12.0–15.0)
MCH: 28.9 pg (ref 26.0–34.0)
MCHC: 33.1 g/dL (ref 30.0–36.0)
MCV: 87.4 fL (ref 78.0–100.0)
Platelets: 322 10*3/uL (ref 150–400)
RBC: 4.12 MIL/uL (ref 3.87–5.11)
RDW: 14.7 % (ref 11.5–15.5)
WBC: 9.3 10*3/uL (ref 4.0–10.5)

## 2016-12-28 LAB — PROTEIN / CREATININE RATIO, URINE
Creatinine, Urine: 51 mg/dL
Protein Creatinine Ratio: 0.12 mg/mg{Cre} (ref 0.00–0.15)
Total Protein, Urine: 6 mg/dL

## 2016-12-28 LAB — COMPREHENSIVE METABOLIC PANEL
ALBUMIN: 3.2 g/dL — AB (ref 3.5–5.0)
ALK PHOS: 94 U/L (ref 38–126)
ALT: 13 U/L — AB (ref 14–54)
AST: 17 U/L (ref 15–41)
Anion gap: 10 (ref 5–15)
BUN: 9 mg/dL (ref 6–20)
CALCIUM: 9.3 mg/dL (ref 8.9–10.3)
CO2: 19 mmol/L — AB (ref 22–32)
CREATININE: 0.48 mg/dL (ref 0.44–1.00)
Chloride: 105 mmol/L (ref 101–111)
GFR calc non Af Amer: >60 mL/min (ref >60)
GLUCOSE: 80 mg/dL (ref 65–99)
Potassium: 3.9 mmol/L (ref 3.5–5.1)
SODIUM: 134 mmol/L — AB (ref 135–145)
Total Bilirubin: 0.7 mg/dL (ref 0.3–1.2)
Total Protein: 6.7 g/dL (ref 6.5–8.1)

## 2016-12-28 MED ORDER — ACETAMINOPHEN 325 MG PO TABS
650.0000 mg | ORAL_TABLET | Freq: Once | ORAL | Status: AC
  Administered 2016-12-28: 650 mg via ORAL
  Filled 2016-12-28: qty 2

## 2016-12-28 NOTE — MAU Provider Note (Signed)
  History     CSN: [id number]  Arrival date and time: 12/28/16 1242   First Provider Initiated Contact with Patient 12/28/16 1345      Chief Complaint  Patient presents with  . Hypertension  . Headache   HPI  OB History    Gravida Para Term Preterm AB Living   1       0 0   SAB TAB Ectopic Multiple Live Births       0          Past Medical History:  Diagnosis Date  . Anemia   . Asthma   . Headache     Past Surgical History:  Procedure Laterality Date  . TONSILLECTOMY AND ADENOIDECTOMY      Family History  Problem Relation Age of Onset  . Hypertension Father   . Diabetes Father   . Hypertension Paternal Grandmother   . Diabetes Paternal Grandmother   . Hypertension Paternal Grandfather   . Diabetes Paternal Grandfather   . Diabetes Maternal Grandmother   . Diabetes Maternal Grandfather   . Multiple sclerosis Mother     Social History  Substance Use Topics  . Smoking status: Never Smoker  . Smokeless tobacco: Never Used  . Alcohol use No    Allergies:  Allergies  Allergen Reactions  . Latex Itching    Itching with any use of latex    Prescriptions Prior to Admission  Medication Sig Dispense Refill Last Dose  . glyBURIDE (DIABETA) 5 MG tablet Take 1 tablet by mouth daily.  0 12/28/2016 at Unknown time  . hydrocortisone 2.5 % cream Apply 1 application topically daily as needed.  11 Past Week at Unknown time  . Prenatal Vit-Fe Fumarate-FA (PRENATAL MULTIVITAMIN) TABS tablet Take 1 tablet by mouth daily at 12 noon.   12/28/2016 at Unknown time  . albuterol (PROVENTIL HFA;VENTOLIN HFA) 108 (90 Base) MCG/ACT inhaler Inhale 2 puffs into the lungs every 6 (six) hours as needed for wheezing or shortness of breath. 1 Inhaler 2 prn    Review of Systems Physical Exam   Blood pressure 119/66, pulse 93, temperature 98.2 F (36.8 C), temperature source Oral, resp. rate 18, last menstrual period 04/22/2016.  Physical Exam  Physical Examination: General  appearance - alert, well appearing, and in no distress Chest - clear to auscultation, no wheezes, rales or rhonchi, symmetric air entry Heart - normal rate and regular rhythm Abdomen - soft, nontender, nondistended, no masses or organomegaly gravid Neurological - alert, oriented, normal speech, no focal findings or movement disorder noted Musculoskeletal - no joint tenderness, deformity or swelling Extremities - peripheral pulses normal, no pedal edema, no clubbing or cyanosis, pedal edema 1 +, Homan's sign negative bilaterally  MAU Course  Procedures  MDM   Assessment and Plan  Proteinuria  with headache.   Pt sent over for labs and serial bps Cat 1 tracing however not reactive.  May need BPP Blood pressure normal.  Will give tylenol for headache.   Awaiting labs Ada diet Check urine cx  Will do 24 hour urine over the weekend  [facility] County[facility]ILLARD,Kelle Ruppert A 12/28/2016, 2:04 PM

## 2016-12-28 NOTE — MAU Note (Addendum)
Pt sent from MD office with C/O HA & proteinuria. Has had HA for the last 4-5 days, has hx of migraines.  Seeing spots @ times.  Also C/O lightheadedness & dizziness.  Has irregular contractions, had cervical check in MD office & is now spotting.  Denies LOF.

## 2016-12-30 DIAGNOSIS — Z3A36 36 weeks gestation of pregnancy: Secondary | ICD-10-CM | POA: Diagnosis not present

## 2016-12-30 DIAGNOSIS — Z369 Encounter for antenatal screening, unspecified: Secondary | ICD-10-CM | POA: Diagnosis not present

## 2016-12-30 DIAGNOSIS — D573 Sickle-cell trait: Secondary | ICD-10-CM | POA: Diagnosis not present

## 2016-12-30 DIAGNOSIS — Z3493 Encounter for supervision of normal pregnancy, unspecified, third trimester: Secondary | ICD-10-CM | POA: Diagnosis not present

## 2016-12-30 DIAGNOSIS — O24419 Gestational diabetes mellitus in pregnancy, unspecified control: Secondary | ICD-10-CM | POA: Diagnosis not present

## 2016-12-30 LAB — URINE CULTURE

## 2017-01-04 DIAGNOSIS — R809 Proteinuria, unspecified: Secondary | ICD-10-CM | POA: Diagnosis not present

## 2017-01-06 ENCOUNTER — Encounter (HOSPITAL_COMMUNITY): Payer: Self-pay | Admitting: *Deleted

## 2017-01-06 ENCOUNTER — Telehealth (HOSPITAL_COMMUNITY): Payer: Self-pay | Admitting: *Deleted

## 2017-01-06 NOTE — Telephone Encounter (Signed)
Preadmission screen  

## 2017-01-07 DIAGNOSIS — Z3A37 37 weeks gestation of pregnancy: Secondary | ICD-10-CM | POA: Diagnosis not present

## 2017-01-07 DIAGNOSIS — O24419 Gestational diabetes mellitus in pregnancy, unspecified control: Secondary | ICD-10-CM | POA: Diagnosis not present

## 2017-01-13 ENCOUNTER — Other Ambulatory Visit: Payer: Self-pay | Admitting: Obstetrics and Gynecology

## 2017-01-14 DIAGNOSIS — O24419 Gestational diabetes mellitus in pregnancy, unspecified control: Secondary | ICD-10-CM | POA: Diagnosis not present

## 2017-01-14 DIAGNOSIS — Z3A38 38 weeks gestation of pregnancy: Secondary | ICD-10-CM | POA: Diagnosis not present

## 2017-01-15 ENCOUNTER — Encounter (HOSPITAL_COMMUNITY)
Admission: AD | Disposition: A | Discharge status: Home / Self Care | Payer: Self-pay | Source: Ambulatory Visit | Attending: Obstetrics and Gynecology

## 2017-01-15 ENCOUNTER — Inpatient Hospital Stay (HOSPITAL_COMMUNITY): Payer: BLUE CROSS/BLUE SHIELD | Admitting: Anesthesiology

## 2017-01-15 ENCOUNTER — Encounter (HOSPITAL_COMMUNITY): Payer: Self-pay | Admitting: *Deleted

## 2017-01-15 ENCOUNTER — Inpatient Hospital Stay (HOSPITAL_COMMUNITY)
Admission: AD | Admit: 2017-01-15 | Discharge: 2017-01-19 | DRG: 766 | Disposition: A | Discharge status: Home / Self Care | Payer: BLUE CROSS/BLUE SHIELD | Source: Ambulatory Visit | Attending: Obstetrics and Gynecology | Admitting: Obstetrics and Gynecology

## 2017-01-15 DIAGNOSIS — D649 Anemia, unspecified: Secondary | ICD-10-CM | POA: Diagnosis not present

## 2017-01-15 DIAGNOSIS — Z6838 Body mass index (BMI) 38.0-38.9, adult: Secondary | ICD-10-CM

## 2017-01-15 DIAGNOSIS — O9902 Anemia complicating childbirth: Secondary | ICD-10-CM | POA: Diagnosis not present

## 2017-01-15 DIAGNOSIS — O24425 Gestational diabetes mellitus in childbirth, controlled by oral hypoglycemic drugs: Principal | ICD-10-CM | POA: Diagnosis present

## 2017-01-15 DIAGNOSIS — Z3A38 38 weeks gestation of pregnancy: Secondary | ICD-10-CM | POA: Diagnosis not present

## 2017-01-15 DIAGNOSIS — J45909 Unspecified asthma, uncomplicated: Secondary | ICD-10-CM | POA: Diagnosis present

## 2017-01-15 DIAGNOSIS — Z23 Encounter for immunization: Secondary | ICD-10-CM | POA: Diagnosis not present

## 2017-01-15 DIAGNOSIS — O99214 Obesity complicating childbirth: Secondary | ICD-10-CM | POA: Diagnosis not present

## 2017-01-15 DIAGNOSIS — R569 Unspecified convulsions: Secondary | ICD-10-CM | POA: Diagnosis not present

## 2017-01-15 DIAGNOSIS — O9952 Diseases of the respiratory system complicating childbirth: Secondary | ICD-10-CM | POA: Diagnosis present

## 2017-01-15 DIAGNOSIS — Z91040 Latex allergy status: Secondary | ICD-10-CM | POA: Diagnosis not present

## 2017-01-15 DIAGNOSIS — Z98891 History of uterine scar from previous surgery: Secondary | ICD-10-CM

## 2017-01-15 DIAGNOSIS — O26893 Other specified pregnancy related conditions, third trimester: Secondary | ICD-10-CM | POA: Diagnosis not present

## 2017-01-15 DIAGNOSIS — Z3A00 Weeks of gestation of pregnancy not specified: Secondary | ICD-10-CM | POA: Diagnosis not present

## 2017-01-15 LAB — CBC
HCT: 34.3 % — ABNORMAL LOW (ref 36.0–46.0)
Hemoglobin: 11.5 g/dL — ABNORMAL LOW (ref 12.0–15.0)
MCH: 29.3 pg (ref 26.0–34.0)
MCHC: 33.5 g/dL (ref 30.0–36.0)
MCV: 87.3 fL (ref 78.0–100.0)
PLATELETS: 298 10*3/uL (ref 150–400)
RBC: 3.93 MIL/uL (ref 3.87–5.11)
RDW: 14.9 % (ref 11.5–15.5)
WBC: 10.2 10*3/uL (ref 4.0–10.5)

## 2017-01-15 LAB — GLUCOSE, CAPILLARY
GLUCOSE-CAPILLARY: 76 mg/dL (ref 65–99)
GLUCOSE-CAPILLARY: 124 mg/dL — AB (ref 65–99)
GLUCOSE-CAPILLARY: 53 mg/dL — AB (ref 65–99)
GLUCOSE-CAPILLARY: 69 mg/dL (ref 65–99)
GLUCOSE-CAPILLARY: 70 mg/dL (ref 65–99)
Glucose-Capillary: 50 mg/dL — ABNORMAL LOW (ref 65–99)
Glucose-Capillary: 80 mg/dL (ref 65–99)

## 2017-01-15 LAB — RPR: RPR Ser Ql: NONREACTIVE

## 2017-01-15 LAB — TYPE AND SCREEN
ABO/RH(D): O POS
Antibody Screen: NEGATIVE

## 2017-01-15 LAB — ABO/RH: ABO/RH(D): O POS

## 2017-01-15 SURGERY — Surgical Case
Anesthesia: Epidural

## 2017-01-15 MED ORDER — OXYCODONE HCL 5 MG/5ML PO SOLN: 5.0000 mg | Freq: Once | ORAL | Status: DC | PRN

## 2017-01-15 MED ORDER — PHENYLEPHRINE 40 MCG/ML (10ML) SYRINGE FOR IV PUSH (FOR BLOOD PRESSURE SUPPORT)
PREFILLED_SYRINGE | INTRAVENOUS | Status: AC
  Filled 2017-01-15: qty 10

## 2017-01-15 MED ORDER — SODIUM CHLORIDE 0.9 % IR SOLN
Status: DC | PRN
  Administered 2017-01-15: 700 mL

## 2017-01-15 MED ORDER — ONDANSETRON HCL 4 MG/2ML IJ SOLN
INTRAMUSCULAR | Status: DC | PRN
  Administered 2017-01-15: 4 mg via INTRAVENOUS

## 2017-01-15 MED ORDER — LACTATED RINGERS IV SOLN
INTRAVENOUS | Status: DC
Start: 2017-01-15 — End: 2017-01-16
  Administered 2017-01-15 (×2): via INTRAVENOUS

## 2017-01-15 MED ORDER — EPHEDRINE 5 MG/ML INJ: 10.0000 mg | INTRAVENOUS | Status: DC | PRN

## 2017-01-15 MED ORDER — PHENYLEPHRINE 40 MCG/ML (10ML) SYRINGE FOR IV PUSH (FOR BLOOD PRESSURE SUPPORT)
PREFILLED_SYRINGE | INTRAVENOUS | Status: AC
  Filled 2017-01-15: qty 20

## 2017-01-15 MED ORDER — SODIUM BICARBONATE 8.4 % IV SOLN
INTRAVENOUS | Status: DC | PRN
  Administered 2017-01-15: 10 mL via EPIDURAL
  Administered 2017-01-15: 5 mL via EPIDURAL

## 2017-01-15 MED ORDER — OXYTOCIN 40 UNITS IN LACTATED RINGERS INFUSION - SIMPLE MED
1.0000 m[IU]/min | INTRAVENOUS | Status: DC
  Administered 2017-01-15: 1 m[IU]/min via INTRAVENOUS
  Filled 2017-01-15: qty 1000

## 2017-01-15 MED ORDER — HYDROMORPHONE HCL 1 MG/ML IJ SOLN: 0.2500 mg | INTRAMUSCULAR | Status: DC | PRN

## 2017-01-15 MED ORDER — PROMETHAZINE HCL 25 MG/ML IJ SOLN: 6.2500 mg | INTRAMUSCULAR | Status: DC | PRN

## 2017-01-15 MED ORDER — ONDANSETRON HCL 4 MG/2ML IJ SOLN: 4.0000 mg | Freq: Four times a day (QID) | INTRAMUSCULAR | Status: DC | PRN

## 2017-01-15 MED ORDER — OXYTOCIN 10 UNIT/ML IJ SOLN
INTRAMUSCULAR | Status: AC
  Filled 2017-01-15: qty 4

## 2017-01-15 MED ORDER — MEPERIDINE HCL 25 MG/ML IJ SOLN: 6.2500 mg | INTRAMUSCULAR | Status: DC | PRN

## 2017-01-15 MED ORDER — LACTATED RINGERS IV SOLN
INTRAVENOUS | Status: DC
  Administered 2017-01-15 (×2): via INTRAUTERINE

## 2017-01-15 MED ORDER — LACTATED RINGERS IV SOLN
INTRAVENOUS | Status: DC | PRN
  Administered 2017-01-15: 21:00:00 via INTRAVENOUS

## 2017-01-15 MED ORDER — KETOROLAC TROMETHAMINE 30 MG/ML IJ SOLN
INTRAMUSCULAR | Status: AC
  Administered 2017-01-15: 30 mg via INTRAVENOUS
  Filled 2017-01-15: qty 1

## 2017-01-15 MED ORDER — MORPHINE SULFATE (PF) 0.5 MG/ML IJ SOLN
INTRAMUSCULAR | Status: AC
  Filled 2017-01-15: qty 10

## 2017-01-15 MED ORDER — MORPHINE SULFATE (PF) 0.5 MG/ML IJ SOLN
INTRAMUSCULAR | Status: DC | PRN
  Administered 2017-01-15: 4 mg via EPIDURAL

## 2017-01-15 MED ORDER — SODIUM BICARBONATE 8.4 % IV SOLN
INTRAVENOUS | Status: AC
  Filled 2017-01-15: qty 50

## 2017-01-15 MED ORDER — LACTATED RINGERS IV SOLN
INTRAVENOUS | Status: DC | PRN
Start: 2017-01-15 — End: 2017-01-15
  Administered 2017-01-15 (×2): via INTRAVENOUS

## 2017-01-15 MED ORDER — LIDOCAINE HCL (PF) 1 % IJ SOLN: 30.0000 mL | INTRAMUSCULAR | Status: DC | PRN

## 2017-01-15 MED ORDER — CEFAZOLIN SODIUM-DEXTROSE 2-4 GM/100ML-% IV SOLN
2.0000 g | Freq: Once | INTRAVENOUS | Status: AC
  Administered 2017-01-15: 2 g via INTRAVENOUS

## 2017-01-15 MED ORDER — LACTATED RINGERS IV SOLN: 500.0000 mL | Freq: Once | INTRAVENOUS | Status: DC

## 2017-01-15 MED ORDER — SOD CITRATE-CITRIC ACID 500-334 MG/5ML PO SOLN
30.0000 mL | ORAL | Status: DC | PRN
  Administered 2017-01-15: 30 mL via ORAL
  Filled 2017-01-15: qty 15

## 2017-01-15 MED ORDER — STERILE WATER FOR IRRIGATION IR SOLN
Status: DC | PRN
  Administered 2017-01-15: 1000 mL

## 2017-01-15 MED ORDER — ONDANSETRON HCL 4 MG/2ML IJ SOLN
INTRAMUSCULAR | Status: AC
  Filled 2017-01-15: qty 2

## 2017-01-15 MED ORDER — DIPHENHYDRAMINE HCL 50 MG/ML IJ SOLN
12.5000 mg | INTRAMUSCULAR | Status: DC | PRN
  Filled 2017-01-15: qty 1

## 2017-01-15 MED ORDER — PHENYLEPHRINE 40 MCG/ML (10ML) SYRINGE FOR IV PUSH (FOR BLOOD PRESSURE SUPPORT): 80.0000 ug | PREFILLED_SYRINGE | INTRAVENOUS | Status: DC | PRN

## 2017-01-15 MED ORDER — KETOROLAC TROMETHAMINE 30 MG/ML IJ SOLN
30.0000 mg | Freq: Once | INTRAMUSCULAR | Status: DC | PRN
  Administered 2017-01-15: 30 mg via INTRAVENOUS

## 2017-01-15 MED ORDER — LACTATED RINGERS IV SOLN
INTRAVENOUS | Status: DC | PRN
  Administered 2017-01-15: 40 [IU] via INTRAVENOUS

## 2017-01-15 MED ORDER — LACTATED RINGERS IV SOLN
500.0000 mL | INTRAVENOUS | Status: DC | PRN
  Administered 2017-01-15: 500 mL via INTRAVENOUS
  Administered 2017-01-15 (×2): 1000 mL via INTRAVENOUS

## 2017-01-15 MED ORDER — DEXTROSE 5 % IV SOLN
500.0000 mg | Freq: Once | INTRAVENOUS | Status: AC
  Administered 2017-01-15 (×2): 500 mg via INTRAVENOUS
  Filled 2017-01-15: qty 500

## 2017-01-15 MED ORDER — MEPERIDINE HCL 25 MG/ML IJ SOLN
INTRAMUSCULAR | Status: DC | PRN
  Administered 2017-01-15: 12.5 mg via INTRAVENOUS

## 2017-01-15 MED ORDER — FENTANYL 2.5 MCG/ML BUPIVACAINE 1/10 % EPIDURAL INFUSION (WH - ANES)
INTRAMUSCULAR | Status: AC
  Administered 2017-01-15: 05:00:00
  Filled 2017-01-15: qty 100

## 2017-01-15 MED ORDER — SCOPOLAMINE 1 MG/3DAYS TD PT72
MEDICATED_PATCH | TRANSDERMAL | Status: DC | PRN
  Administered 2017-01-15: 1 via TRANSDERMAL

## 2017-01-15 MED ORDER — ACETAMINOPHEN 325 MG PO TABS
650.0000 mg | ORAL_TABLET | ORAL | Status: DC | PRN
Start: 2017-01-15 — End: 2017-01-16

## 2017-01-15 MED ORDER — TERBUTALINE SULFATE 1 MG/ML IJ SOLN: 0.2500 mg | Freq: Once | INTRAMUSCULAR | Status: DC | PRN

## 2017-01-15 MED ORDER — MEPERIDINE HCL 25 MG/ML IJ SOLN
INTRAMUSCULAR | Status: AC
  Filled 2017-01-15: qty 1

## 2017-01-15 MED ORDER — OXYTOCIN BOLUS FROM INFUSION: 500.0000 mL | Freq: Once | INTRAVENOUS | Status: DC

## 2017-01-15 MED ORDER — FENTANYL CITRATE (PF) 100 MCG/2ML IJ SOLN: 50.0000 ug | INTRAMUSCULAR | Status: DC | PRN

## 2017-01-15 MED ORDER — FENTANYL 2.5 MCG/ML BUPIVACAINE 1/10 % EPIDURAL INFUSION (WH - ANES)
14.0000 mL/h | INTRAMUSCULAR | Status: DC | PRN
  Administered 2017-01-15 (×2): 14 mL/h via EPIDURAL
  Filled 2017-01-15: qty 100

## 2017-01-15 MED ORDER — OXYCODONE HCL 5 MG PO TABS: 5.0000 mg | ORAL_TABLET | Freq: Once | ORAL | Status: DC | PRN

## 2017-01-15 MED ORDER — LIDOCAINE-EPINEPHRINE (PF) 2 %-1:200000 IJ SOLN
INTRAMUSCULAR | Status: AC
  Filled 2017-01-15: qty 20

## 2017-01-15 MED ORDER — OXYTOCIN 40 UNITS IN LACTATED RINGERS INFUSION - SIMPLE MED: 2.5000 [IU]/h | INTRAVENOUS | Status: DC

## 2017-01-15 SURGICAL SUPPLY — 41 items
BARRIER ADHS 3X4 INTERCEED (GAUZE/BANDAGES/DRESSINGS) ×2 IMPLANT
BENZOIN TINCTURE PRP APPL 2/3 (GAUZE/BANDAGES/DRESSINGS) ×2 IMPLANT
CHLORAPREP W/TINT 26ML (MISCELLANEOUS) ×2 IMPLANT
CLAMP CORD UMBIL (MISCELLANEOUS) IMPLANT
CLOSURE STERI STRIP 1/2 X4 (GAUZE/BANDAGES/DRESSINGS) ×2 IMPLANT
CLOTH BEACON ORANGE TIMEOUT ST (SAFETY) ×2 IMPLANT
DERMABOND ADVANCED (GAUZE/BANDAGES/DRESSINGS)
DERMABOND ADVANCED .7 DNX12 (GAUZE/BANDAGES/DRESSINGS) IMPLANT
DRSG OPSITE POSTOP 4X10 (GAUZE/BANDAGES/DRESSINGS) ×2 IMPLANT
ELECT REM PT RETURN 9FT ADLT (ELECTROSURGICAL) ×2
ELECTRODE REM PT RTRN 9FT ADLT (ELECTROSURGICAL) ×1 IMPLANT
EXTRACTOR VACUUM KIWI (MISCELLANEOUS) IMPLANT
GLOVE BIOGEL PI IND STRL 6.5 (GLOVE) ×1 IMPLANT
GLOVE BIOGEL PI IND STRL 7.0 (GLOVE) ×2 IMPLANT
GLOVE BIOGEL PI INDICATOR 6.5 (GLOVE) ×1
GLOVE BIOGEL PI INDICATOR 7.0 (GLOVE) ×2
GLOVE ECLIPSE 6.5 STRL STRAW (GLOVE) ×2 IMPLANT
GOWN STRL REUS W/TWL LRG LVL3 (GOWN DISPOSABLE) ×6 IMPLANT
HOVERMATT SINGLE USE (MISCELLANEOUS) ×2 IMPLANT
KIT ABG SYR 3ML LUER SLIP (SYRINGE) IMPLANT
NEEDLE HYPO 25X5/8 SAFETYGLIDE (NEEDLE) IMPLANT
NS IRRIG 1000ML POUR BTL (IV SOLUTION) ×2 IMPLANT
PACK C SECTION WH (CUSTOM PROCEDURE TRAY) ×2 IMPLANT
PAD ABD 7.5X8 STRL (GAUZE/BANDAGES/DRESSINGS) ×2 IMPLANT
PAD OB MATERNITY 4.3X12.25 (PERSONAL CARE ITEMS) ×2 IMPLANT
PENCIL SMOKE EVAC W/HOLSTER (ELECTROSURGICAL) ×2 IMPLANT
RTRCTR C-SECT PINK 25CM LRG (MISCELLANEOUS) ×2 IMPLANT
STRIP CLOSURE SKIN 1/2X4 (GAUZE/BANDAGES/DRESSINGS) ×2 IMPLANT
SUT PLAIN 0 NONE (SUTURE) IMPLANT
SUT PLAIN 2 0 (SUTURE) ×1
SUT PLAIN 2 0 XLH (SUTURE) IMPLANT
SUT PLAIN ABS 2-0 CT1 27XMFL (SUTURE) ×1 IMPLANT
SUT VIC AB 0 CT1 27 (SUTURE) ×2
SUT VIC AB 0 CT1 27XBRD ANBCTR (SUTURE) ×2 IMPLANT
SUT VIC AB 0 CTX 36 (SUTURE) ×3
SUT VIC AB 0 CTX36XBRD ANBCTRL (SUTURE) ×3 IMPLANT
SUT VIC AB 2-0 CT1 27 (SUTURE) ×1
SUT VIC AB 2-0 CT1 TAPERPNT 27 (SUTURE) ×1 IMPLANT
SUT VIC AB 4-0 KS 27 (SUTURE) ×2 IMPLANT
TOWEL OR 17X24 6PK STRL BLUE (TOWEL DISPOSABLE) ×2 IMPLANT
TRAY FOLEY BAG SILVER LF 14FR (SET/KITS/TRAYS/PACK) IMPLANT

## 2017-01-15 NOTE — MAU Note (Signed)
PT  SAYS  SROM  AT 0230- CLEAR.     VE  TODAY IN OFFICE  3 CM.    DENIES HSV AND  MRSA.   GBS- NEG

## 2017-01-15 NOTE — Progress Notes (Signed)
Hypoglycemic Event  CBG: 53  Treatment: 15 GM carbohydrate snack  Symptoms: None  Follow-up CBG: Time:1400 CBG Result:69  Possible Reasons for Event: Inadequate meal intake  Comments/MD notified:Rivard aware--orders to provide 15gram carbs and recheck    Carles Collethornton, Florentine Diekman Nichole

## 2017-01-15 NOTE — Progress Notes (Signed)
  Subjective: Feeling vaginal and rectal pressure.  Objective: BP (!) 109/54   Pulse 89   Temp 98.4 F (36.9 C) (Oral)   Resp 18   Ht 5\' 6"  (1.676 m)   Wt 108 kg (238 lb)   LMP 04/22/2016   SpO2 100%   BMI 38.41 kg/m  No intake/output data recorded. No intake/output data recorded.  FHT: Category 1 UC:   irregular, every 2-4 minutes, current run of tachysystole with mild variables. SVE:   Dilation: 6.5 Effacement (%): 90 Station: 0 Exam by:: Vivki, CNM  Slightly tight outlet, vtx very low in pelvis Pitocin at 6 mu/min--decreased to 3 mu/min.  Assessment:  Active labor GBS negative SROM x 14 hours  Plan: Continue current care. Position to facilitate rotation/descent.  Nigel BridgemanLATHAM, Carlous Olivares CNM 01/15/2017, 4:11 PM

## 2017-01-15 NOTE — Progress Notes (Signed)
DO present at bedside, discussing risk and benefits for cesarean delivery. Patient and family understands risks and benefits. No questions or concerns. Consents Signed and verified by witnessing RN, patient and DO. Patient prepped for cesarean delivery.

## 2017-01-15 NOTE — Transfer of Care (Signed)
Immediate Anesthesia Transfer of Care Note  Patient: Brandy Newton  Procedure(s) Performed: Procedure(s): CESAREAN SECTION (N/A)  Patient Location: PACU  Anesthesia Type:Epidural  Level of Consciousness: awake  Airway & Oxygen Therapy: Patient Spontanous Breathing  Post-op Assessment: Report given to RN and Post -op Vital signs reviewed and stable  Post vital signs: stable  Last Vitals:  Vitals:   01/15/17 1900 01/15/17 1957  BP: 121/77   Pulse: 90   Resp: 18   Temp:  37.7 C  SpO2:      Last Pain:  Vitals:   01/15/17 1957  TempSrc: Oral  PainSc:          Complications: No apparent anesthesia complications

## 2017-01-15 NOTE — Anesthesia Procedure Notes (Signed)
Epidural Patient location during procedure: OB Start time: 01/15/2017 4:58 AM End time: 01/15/2017 5:11 AM  Staffing Anesthesiologist: Heather RobertsSINGER, Tiant Peixoto Performed: anesthesiologist   Preanesthetic Checklist Completed: patient identified, site marked, pre-op evaluation, timeout performed, IV checked, risks and benefits discussed and monitors and equipment checked  Epidural Patient position: sitting Prep: DuraPrep Patient monitoring: heart rate, cardiac monitor, continuous pulse ox and blood pressure Approach: midline Location: L2-L3 Injection technique: LOR saline  Needle:  Needle type: Tuohy  Needle gauge: 17 G Needle length: 9 cm Needle insertion depth: 5 cm Catheter size: 20 Guage Catheter at skin depth: 11 cm Test dose: negative and Other  Assessment Events: blood not aspirated, injection not painful, no injection resistance and negative IV test  Additional Notes Informed consent obtained prior to proceeding including risk of failure, 1% risk of PDPH, risk of minor discomfort and bruising.  Discussed rare but serious complications including epidural abscess, permanent nerve injury, epidural hematoma.  Discussed alternatives to epidural analgesia and patient desires to proceed.  Timeout performed pre-procedure verifying patient name, procedure, and platelet count.  Patient tolerated procedure well.

## 2017-01-15 NOTE — Progress Notes (Addendum)
  Subjective: Comfortable with epidural.  Objective: BP 113/66   Pulse 88   Temp 98.9 F (37.2 C) (Oral)   Resp 18   Ht 5\' 6"  (1.676 m)   Wt 108 kg (238 lb)   LMP 04/22/2016   SpO2 100%   BMI 38.41 kg/m  I/O last 3 completed shifts: In: -  Out: 1875 [Urine:1875] No intake/output data recorded.  FHT: Category 2--decreased variability, mild tachycardia, 160s, occasional late and variable decel UC:   regular, every 2 minutes SVE:   Dilation: 6.5 Effacement (%): 90 Station: 0 Exam by:: Chip BoerVicki, CNM  Cervix edematous, no descent, caput noted. Vaginal vault feels hot. Pitocin at 3 mu/min CBG (last 3)   Recent Labs  01/15/17 1328 01/15/17 1406 01/15/17 1751  GLUCAP 53* 69 80     Assessment:  Arrest of active labor Segments of non-reassuring FHR GDM SROM x 16 hours.   Plan: Recommendation for C/S due to arrest of active phase.  Consulted with Dr. Charlotta Newtonzan, who concurs with recommendation. Risks and benefits of cesarean were reviewed with patient and family, including anesthesia, bleeding, infection, and damage to other organs.  Patient and family seem to understand these risks and are in agreement with proceeding with cesarean. Case will follow current case of Dr. Charlotta Newtonzan. Ancef and Azithromycin order for pre-op meds.  [name], Brandy Newton CNM 01/15/2017, 7p

## 2017-01-15 NOTE — H&P (Signed)
Brandy Newton is a 31 y.o. female, G1P0 at 38.2 weeks, presenting for SROM.  Patient reports hearing "a pop" and then having a gush of fluid around 0230.  Patient states that fluid was clear with some reddish mucous type discharge.  Patient is currently taking Glyburide and last dose was last night.  Reports HS blood sugar was 114.  Patient is GBS negative and does desire an epidural for pain management.  Patient reports that FOB is en route to hospital from [place].  Patient Active Problem List   Diagnosis Date Noted  . Family history of sickle cell trait 10/09/2016  . Family history of diabetes mellitus (DM) 10/09/2016  . Family history of hypertension 10/09/2016    History of present pregnancy: Patient entered care at 9 weeks.   EDC of 01/27/2017 was established by Definite LMP of 04/22/2016.   Anatomy scan:  19.2 weeks, with normal findings   Additional [place] evaluations:   BPP Weekly starting at 32 wks Growth Q 4 weeks  Significant prenatal events: Patient with common pregnancy discomforts to include back pain, fatigue, cramping, increased vaginal discharge, pelvic pressure, and headaches.   Last evaluation:  01/14/2017 in office by Dr. Kathie Rhodes. Rivard  VE 2/80/-2, BP 124/60, Wt 239.25lbs TWG: 42.25lbs  OB History    Gravida Para Term Preterm AB Living   1       0 0   SAB TAB Ectopic Multiple Live Births       0         Past Medical History:  Diagnosis Date  . Anemia   . Asthma   . Gestational diabetes    glyburide  . Headache    Past Surgical History:  Procedure Laterality Date  . TONSILLECTOMY AND ADENOIDECTOMY    . WISDOM TOOTH EXTRACTION     Family History: family history includes Diabetes in her father, maternal grandfather, maternal grandmother, paternal grandfather, and paternal grandmother; Hypertension in her father, paternal grandfather, and paternal grandmother; Multiple sclerosis in her mother. Social History:  reports that she has never smoked. She has never used  smokeless tobacco. She reports that she does not drink alcohol or use drugs.   Prenatal Transfer Tool  Maternal Diabetes: Yes:  Diabetes Type:  Diet controlled Genetic Screening: Normal-Quad Maternal Ultrasounds/Referrals: Normal Fetal Ultrasounds or other Referrals:  None Maternal Substance Abuse:  No Significant Maternal Medications:  Meds include: Other: Glyburide-15mg  Significant Maternal Lab Results: Lab values include: Group B Strep negative    ROS:  +LOF, +Ctx, Unsure of Fetal Movement,   Allergies  Allergen Reactions  . Latex Itching    Itching with any use of latex     Dilation: 3 Effacement (%): 70 Station: -1 Exam by:: [name] CNM Blood pressure 126/75, pulse 80, temperature 98.2 F (36.8 C), temperature source Oral, resp. rate 20, height 5\' 6"  (1.676 m), weight 108 kg (238 lb), last menstrual period 04/22/2016.  Physical Exam  Constitutional: She is oriented to person, place, and time. She appears well-developed and well-nourished. No distress.  HENT:  Head: Normocephalic and atraumatic.  Eyes: Conjunctivae are normal.  Neck: Normal range of motion.  Cardiovascular: Normal rate, regular rhythm and normal heart sounds.   Respiratory: Effort normal and breath sounds normal.  GI: Soft. Bowel sounds are normal.  Musculoskeletal: Normal range of motion. She exhibits edema.  Neurological: She is alert and oriented to person, place, and time.  Skin: Skin is warm and dry.  Psychiatric: She has a normal mood and  affect. Her behavior is normal.    Leopolds: EFW: EFW= 8lb 2oz, 84% by US on 8/30 Presentation: Vertex by Exam  FHR: 150 bpm, Mod Var, -Decels, +Accels UCs:  Palpates mild  Prenatal labs: ABO, Rh: O/Positive/-- (02/07 0000) Antibody: Negative (02/07 0000) Rubella:  Immune RPR: Nonreactive (02/07 0000)  HBsAg: Negative (02/07 0000)  HIV: Non-reactive (02/07 0000)  GBS:  Negative Sickle cell/Hgb electrophoresis:  Normal Pap:  Normal in 2017 GC:   Negative Chlamydia:  negative Other:  None    Assessment IUP at 38.2wks Cat I FT SROM GBS Negative GDM-A2  [facility]: [facility]  Routine Labor and Delivery Orders per [facility] Protocol In room to complete assessment and discuss POC: BS Q4 hrs Discussed pain management Expectant Management at current Dr.A. Su Hiltoberts to be updated as appropriate  Joellyn QuailsJessica L EmlyCNM, MSN 01/15/2017, 4:17 AM

## 2017-01-15 NOTE — Op Note (Signed)
PreOp Diagnosis:  -Intrauterine pregnancy @ 3865w2d -Failure to progress -Obesity: BMI 38  PostOp Diagnosis: same and OP presentation Procedure: Primary LTCS Surgeon: Dr. Myna HidalgoJennifer Ashia Dehner Assistant: Bernerd PhoNancy Prothero, CNM Anesthesia: epidural Complications: none EBL: 581cc UOP: 175cc Fluids: 1800cc  Findings: Female infant from OP asynclitic presentation, tight nuchal cord.  Normal uterus, tubes and ovaries bilaterally.  PROCEDURE:  Informed consent was obtained from the patient with risks, benefits, complications, treatment options, and expected outcomes discussed with the patient.  The patient concurred with the proposed plan, giving informed consent with form signed.   The patient was taken to Operating Room, and identified with the procedure verified as C-Section Delivery with Time Out. With induction of anesthesia, the patient was prepped and draped in the usual sterile fashion. A Pfannenstiel incision was made and carried down through the subcutaneous tissue to the fascia. The fascia was incised in the midline and extended transversely. The superior aspect of the fascial incision was grasped with Kochers elevated and the underlying muscle dissected off. The inferior aspect of the facial incision was in similar fashion, grasped elevated and rectus muscles dissected off. The peritoneum was identified and entered. Peritoneal incision was extended longitudinally. The Alexis retractor was placed.  The utero-vesical peritoneal reflection was identified and incised transversely with the [facility] scissors, the incision extended laterally, the bladder flap created digitally. A low transverse uterine incision was made and the infants head delivered atraumatically. After the umbilical cord was clamped and cut cord blood was obtained for evaluation.   The placenta was removed intact and appeared normal. The uterine outline, tubes and ovaries appeared normal. The uterine incision was closed with running locked  sutures of 0 Vicryl and a second layer of the same stitch was used in an imbricating fashion.  Excellent hemostasis was obtained.  The pericolic gutters were then cleared of all clots and debris. Interceed was placed over the hysterotomy.  The peritoneum was closed in a running fashion. The fascia was then reapproximated with running sutures of 0 Vicryl. The subcutaneous tissue was reapproximated with 2-0 plain gut suture.   The skin was closed with 4-0 vicryl in a subcuticular fashion.  Instrument, sponge, and needle counts were correct prior the abdominal closure and at the conclusion of the case. The patient was taken to recovery in stable condition.  Myna HidalgoJennifer Ariabella Brien, DO [phone] (pager) [phone] (office)

## 2017-01-15 NOTE — Progress Notes (Signed)
  Subjective: Doing well, comfortable s/p epidural.  Mother-in-law and sister at bedside.  Objective: BP 114/70   Pulse 76   Temp 97.9 F (36.6 C) (Oral)   Resp 18   Ht 5\' 6"  (1.676 m)   Wt 108 kg (238 lb)   LMP 04/22/2016   SpO2 100%   BMI 38.41 kg/m  No intake/output data recorded. No intake/output data recorded.   Vitals:   01/15/17 0555 01/15/17 0600 01/15/17 0630 01/15/17 0700  BP: 106/72 123/70 113/61 114/70  Pulse: 81 90 76 76  Resp: 18 18 18 18   Temp:  97.9 F (36.6 C)    TempSrc:  Oral    SpO2:      Weight:      Height:       CBG (last 3)   Recent Labs  01/15/17 0554  GLUCAP 76     FHT: Category 1 UC:   irregular, every 3-6 minutes SVE:   Dilation: 3 Effacement (%): 70 Station: -2 Exam by:: Dorathy DaftShay Payne RN  at 0530 Leaking clear fluid  Assessment:  IUP at 38 2/7 weeks SROM at 0230 Gest DM, on glyburide. GBS negative  Plan: Assess cervix by 0930. Augment prn. Check CBGs q 4 hours.   Nigel BridgemanLATHAM, Aamori Mcmasters CNM 01/15/2017, 8:01 AM

## 2017-01-15 NOTE — Progress Notes (Addendum)
  Subjective: Comfortable with epidural.  Objective: BP 124/70   Pulse 78   Temp 98.4 F (36.9 C) (Oral)   Resp 18   Ht 5\' 6"  (1.676 m)   Wt 108 kg (238 lb)   LMP 04/22/2016   SpO2 100%   BMI 38.41 kg/m  No intake/output data recorded. No intake/output data recorded.  FHT: Category 1--tracing improved with position change UC:   irregular, every 2-5 minutes SVE:   Dilation: 5 Effacement (%): 90 Station: -1 Exam by:: Chip BoerVicki, CNM  MVUs 90  CBG (last 3)   Recent Labs  01/15/17 0554 01/15/17 1106  GLUCAP 76 124*  Had sugared jello prior to CBG.    Assessment:  Early labor, inadequate contractions. GBS negative SROM x 10 hours GDM  Plan: Start pitocin per low dose protocol. Close observation of FHR status. Recheck CBG in 2 hours--needs carb modified diet.  Nigel BridgemanLATHAM, Joash Tony CNM 01/15/2017, 12:26 PM   Addendum: CBG at 2p = 53.  Patient asymptomatic. Gave juice. Consulted with Dr. Mitzie Naivard--plan treatment with juice and clear liquid diet, no need for glucose stabilizer.  Recheck at 2p--69.  Will check q 2 hours.  Nigel BridgemanVicki Kersten Salmons, CNM 8/31/1/8 2:15p

## 2017-01-15 NOTE — Anesthesia Pain Management Evaluation Note (Signed)
  CRNA Pain Management Visit Note  Patient: Brandy Newton, 31 y.o., female  "Hello [facility] We have an anesthesia team available at all times to provide care throughout the hospital, including epidural management and anesthesia for C-section. I don't know your plan for the delivery whether it a natural birth, water birth, IV sedation, nitrous supplementation, doula or epidural, but we want to meet your pain goals."   1.Was your pain managed to your expectations on prior hospitalizations?   No prior hospitalizations  2.What is your expectation for pain management during this hospitalization?     Epidural  3.How can we help you reach that goal?   Record the patient's initial score and the patient's pain goal.   Pain: 0  Pain Goal: 6 [facility] wants you to be able to say your pain was always managed very well.  Laban EmperorMalinova,Janique Hoefer Hristova 01/15/2017

## 2017-01-15 NOTE — Progress Notes (Signed)
At bedside to review plan for primary c-section duet to failure to progress.  Risk benefits and alternatives of cesarean section were discussed with the patient including but not limited to infection, bleeding, damage to bowel , bladder and baby with the need for further surgery. Pt voiced understanding and desires to proceed.   Myna HidalgoJennifer Carlie Corpus, DO [phone] (pager) [phone] (office)

## 2017-01-15 NOTE — Progress Notes (Addendum)
  Subjective: Comfortable with epidural.  Husband here now.  Objective: BP 114/70   Pulse 76   Temp 97.9 F (36.6 C) (Oral)   Resp 18   Ht 5\' 6"  (1.676 m)   Wt 108 kg (238 lb)   LMP 04/22/2016   SpO2 100%   BMI 38.41 kg/m  No intake/output data recorded. No intake/output data recorded.  FHT: Category 2--occasional variables UC:   Regular, q 3-4 min. SVE:   4-5 cm, 90%, vtx, -1 IUPC and FSE applied without difficulty Very light MSF noted.   Assessment:  Early labor SROM x 7.5 hrs. GBS negative GDM  Plan: Observe quality of UCs--augment if inadequate. Position to facilitate rotation/descent. Amnioinfusion prn.    Nigel BridgemanLATHAM, Aylssa Herrig CNM 01/15/2017, 9:44 AM

## 2017-01-15 NOTE — Anesthesia Preprocedure Evaluation (Addendum)
Anesthesia Evaluation  Patient identified by MRN, date of birth, ID band Patient awake    Reviewed: Allergy & Precautions, NPO status , Patient's Chart, lab work & pertinent test results  Airway Mallampati: III  TM Distance: >3 FB Neck ROM: Full    Dental no notable dental hx. (+) Dental Advisory Given   Pulmonary asthma ,    Pulmonary exam normal        Cardiovascular negative cardio ROS Normal cardiovascular exam Rhythm:Regular     Neuro/Psych negative neurological ROS  negative psych ROS   GI/Hepatic negative GI ROS, Neg liver ROS,   Endo/Other  diabetesMorbid obesity  Renal/GU negative Renal ROS     Musculoskeletal negative musculoskeletal ROS (+)   Abdominal   Peds  Hematology  (+) anemia ,   Anesthesia Other Findings   Reproductive/Obstetrics (+) Pregnancy                            Anesthesia Physical Anesthesia Plan  ASA: III  Anesthesia Plan: Epidural   Post-op Pain Management:    Induction:   PONV Risk Score and Plan: 2 and Ondansetron and Treatment may vary due to age or medical condition  Airway Management Planned:   Additional Equipment:   Intra-op Plan:   Post-operative Plan:   Informed Consent: I have reviewed the patients History and Physical, chart, labs and discussed the procedure including the risks, benefits and alternatives for the proposed anesthesia with the patient or authorized representative who has indicated his/her understanding and acceptance.   Dental advisory given  Plan Discussed with: CRNA  Anesthesia Plan Comments:        Anesthesia Quick Evaluation

## 2017-01-16 ENCOUNTER — Encounter (HOSPITAL_COMMUNITY): Payer: Self-pay | Admitting: Obstetrics & Gynecology

## 2017-01-16 LAB — GLUCOSE, CAPILLARY
GLUCOSE-CAPILLARY: 106 mg/dL — AB (ref 65–99)
GLUCOSE-CAPILLARY: 67 mg/dL (ref 65–99)
GLUCOSE-CAPILLARY: 79 mg/dL (ref 65–99)
GLUCOSE-CAPILLARY: 85 mg/dL (ref 65–99)

## 2017-01-16 LAB — CBC
HEMATOCRIT: 33.2 % — AB (ref 36.0–46.0)
Hemoglobin: 11.2 g/dL — ABNORMAL LOW (ref 12.0–15.0)
MCH: 29.2 pg (ref 26.0–34.0)
MCHC: 33.7 g/dL (ref 30.0–36.0)
MCV: 86.7 fL (ref 78.0–100.0)
PLATELETS: 260 10*3/uL (ref 150–400)
RBC: 3.83 MIL/uL — ABNORMAL LOW (ref 3.87–5.11)
RDW: 15.1 % (ref 11.5–15.5)
WBC: 13.9 10*3/uL — AB (ref 4.0–10.5)

## 2017-01-16 MED ORDER — NALOXONE HCL 0.4 MG/ML IJ SOLN: 0.4000 mg | INTRAMUSCULAR | Status: DC | PRN

## 2017-01-16 MED ORDER — SODIUM CHLORIDE 0.9% FLUSH: 3.0000 mL | INTRAVENOUS | Status: DC | PRN

## 2017-01-16 MED ORDER — ALBUTEROL SULFATE (2.5 MG/3ML) 0.083% IN NEBU: 3.0000 mL | INHALATION_SOLUTION | Freq: Four times a day (QID) | RESPIRATORY_TRACT | Status: DC | PRN

## 2017-01-16 MED ORDER — DIBUCAINE 1 % RE OINT: 1.0000 "application " | TOPICAL_OINTMENT | RECTAL | Status: DC | PRN

## 2017-01-16 MED ORDER — DIPHENHYDRAMINE HCL 50 MG/ML IJ SOLN: 12.5000 mg | INTRAMUSCULAR | Status: DC | PRN

## 2017-01-16 MED ORDER — SIMETHICONE 80 MG PO CHEW
80.0000 mg | CHEWABLE_TABLET | Freq: Three times a day (TID) | ORAL | Status: DC
  Administered 2017-01-16 – 2017-01-19 (×9): 80 mg via ORAL
  Filled 2017-01-16 (×10): qty 1

## 2017-01-16 MED ORDER — SCOPOLAMINE 1 MG/3DAYS TD PT72
1.0000 | MEDICATED_PATCH | Freq: Once | TRANSDERMAL | Status: DC
  Filled 2017-01-16: qty 1

## 2017-01-16 MED ORDER — TETANUS-DIPHTH-ACELL PERTUSSIS 5-2.5-18.5 LF-MCG/0.5 IM SUSP: 0.5000 mL | Freq: Once | INTRAMUSCULAR | Status: DC

## 2017-01-16 MED ORDER — SIMETHICONE 80 MG PO CHEW
80.0000 mg | CHEWABLE_TABLET | ORAL | Status: DC
  Administered 2017-01-16 – 2017-01-19 (×3): 80 mg via ORAL
  Filled 2017-01-16 (×3): qty 1

## 2017-01-16 MED ORDER — NALBUPHINE HCL 10 MG/ML IJ SOLN: 5.0000 mg | Freq: Once | INTRAMUSCULAR | Status: DC | PRN

## 2017-01-16 MED ORDER — DEXTROSE 5 % IV SOLN
1.0000 ug/kg/h | INTRAVENOUS | Status: DC | PRN
  Filled 2017-01-16: qty 2

## 2017-01-16 MED ORDER — LACTATED RINGERS IV SOLN
INTRAVENOUS | Status: DC
  Administered 2017-01-16 (×2): via INTRAVENOUS

## 2017-01-16 MED ORDER — MENTHOL 3 MG MT LOZG: 1.0000 | LOZENGE | OROMUCOSAL | Status: DC | PRN

## 2017-01-16 MED ORDER — ONDANSETRON HCL 4 MG/2ML IJ SOLN: 4.0000 mg | Freq: Three times a day (TID) | INTRAMUSCULAR | Status: DC | PRN

## 2017-01-16 MED ORDER — PRENATAL MULTIVITAMIN CH
1.0000 | ORAL_TABLET | Freq: Every day | ORAL | Status: DC
  Administered 2017-01-16 – 2017-01-18 (×3): 1 via ORAL
  Filled 2017-01-16 (×3): qty 1

## 2017-01-16 MED ORDER — NALBUPHINE HCL 10 MG/ML IJ SOLN
5.0000 mg | INTRAMUSCULAR | Status: DC | PRN
  Administered 2017-01-16 (×2): 5 mg via INTRAVENOUS
  Filled 2017-01-16 (×2): qty 1

## 2017-01-16 MED ORDER — IBUPROFEN 600 MG PO TABS
600.0000 mg | ORAL_TABLET | Freq: Four times a day (QID) | ORAL | Status: DC
  Administered 2017-01-16 – 2017-01-19 (×13): 600 mg via ORAL
  Filled 2017-01-16 (×13): qty 1

## 2017-01-16 MED ORDER — SENNOSIDES-DOCUSATE SODIUM 8.6-50 MG PO TABS
2.0000 | ORAL_TABLET | ORAL | Status: DC
  Administered 2017-01-16 – 2017-01-19 (×3): 2 via ORAL
  Filled 2017-01-16 (×3): qty 2

## 2017-01-16 MED ORDER — SIMETHICONE 80 MG PO CHEW: 80.0000 mg | CHEWABLE_TABLET | ORAL | Status: DC | PRN

## 2017-01-16 MED ORDER — NALBUPHINE HCL 10 MG/ML IJ SOLN: 5.0000 mg | INTRAMUSCULAR | Status: DC | PRN

## 2017-01-16 MED ORDER — COCONUT OIL OIL
1.0000 "application " | TOPICAL_OIL | Status: DC | PRN
  Administered 2017-01-16: 1 via TOPICAL
  Filled 2017-01-16: qty 120

## 2017-01-16 MED ORDER — ZOLPIDEM TARTRATE 5 MG PO TABS: 5.0000 mg | ORAL_TABLET | Freq: Every evening | ORAL | Status: DC | PRN

## 2017-01-16 MED ORDER — OXYTOCIN 40 UNITS IN LACTATED RINGERS INFUSION - SIMPLE MED: 2.5000 [IU]/h | INTRAVENOUS | Status: AC

## 2017-01-16 MED ORDER — ACETAMINOPHEN 325 MG PO TABS
650.0000 mg | ORAL_TABLET | ORAL | Status: DC | PRN
  Administered 2017-01-16 – 2017-01-17 (×2): 650 mg via ORAL
  Filled 2017-01-16 (×3): qty 2

## 2017-01-16 MED ORDER — DIPHENHYDRAMINE HCL 50 MG/ML IJ SOLN
12.5000 mg | INTRAMUSCULAR | Status: DC | PRN
  Administered 2017-01-16: 12.5 mg via INTRAVENOUS

## 2017-01-16 MED ORDER — DIPHENHYDRAMINE HCL 25 MG PO CAPS
25.0000 mg | ORAL_CAPSULE | ORAL | Status: DC | PRN
  Administered 2017-01-16 (×2): 25 mg via ORAL
  Filled 2017-01-16 (×2): qty 1

## 2017-01-16 MED ORDER — OXYCODONE HCL 5 MG PO TABS
10.0000 mg | ORAL_TABLET | ORAL | Status: DC | PRN
  Administered 2017-01-17 – 2017-01-19 (×6): 10 mg via ORAL
  Filled 2017-01-16 (×7): qty 2

## 2017-01-16 MED ORDER — DIPHENHYDRAMINE HCL 25 MG PO CAPS
25.0000 mg | ORAL_CAPSULE | Freq: Four times a day (QID) | ORAL | Status: DC | PRN
  Administered 2017-01-16: 25 mg via ORAL
  Filled 2017-01-16: qty 1

## 2017-01-16 MED ORDER — OXYCODONE HCL 5 MG PO TABS
5.0000 mg | ORAL_TABLET | ORAL | Status: DC | PRN
  Administered 2017-01-16 – 2017-01-19 (×5): 5 mg via ORAL
  Filled 2017-01-16 (×4): qty 1

## 2017-01-16 MED ORDER — WITCH HAZEL-GLYCERIN EX PADS: 1.0000 "application " | MEDICATED_PAD | CUTANEOUS | Status: DC | PRN

## 2017-01-16 NOTE — Anesthesia Postprocedure Evaluation (Signed)
Anesthesia Post Note  Patient: [name]  Procedure(s) Performed: Procedure(s) (LRB): CESAREAN SECTION (N/A)     Patient location during evaluation: PACU Anesthesia Type: Epidural Level of consciousness: awake and alert Pain management: pain level controlled Vital Signs Assessment: post-procedure vital signs reviewed and stable Respiratory status: spontaneous breathing Cardiovascular status: stable Postop Assessment: epidural receding Anesthetic complications: no    Last Vitals:  Vitals:   01/16/17 0140 01/16/17 0210  BP: (!) 153/68 125/76  Pulse: 91 97  Resp: 18 12  Temp: 37.1 C 36.8 C  SpO2:      Last Pain:  Vitals:   01/16/17 0210  TempSrc: Oral  PainSc: 0-No pain   Pain Goal:                 Lewie LoronJohn Ignace Mandigo

## 2017-01-16 NOTE — Plan of Care (Signed)
Problem: Skin Integrity: Goal: Demonstration of wound healing without infection will improve Outcome: Progressing Around [id number] Mom became extremely, frantically itchy. Obtained read-back order from Cam HaiKimberly Shaw for benedryl as duplicate order was not showing as released, assessed for signs/symptoms of an allergic reaction/anaphylaxsis. Mom denied any symptom other than itching

## 2017-01-16 NOTE — Lactation Note (Signed)
This note was copied from a baby's chart. Lactation Consultation Note  Patient Name: Brandy Bolivar Hawiesha Fuchs ZOXWR'UToday's Date: 01/16/2017 Reason for consult: Follow-up assessment;NICU baby;Term , who is now in NICU for hypoglycemia. I did teaching with mom of providing EBM for a NICU baby. Mom is still on IV fluids, and her breasts are edematous, with doughy feeling areolas. I decreased mom to 21 flanges, with a better fit - much ess aareola pulling. Mom to apply coconut oil to her nipples pre pumping.  Mom was able to get only a glistening of colostrum from her right breast. I explained how this is normal for now, but to pump every 3 hours, preferably after being with her baby, and then HE.  I also advised mom to try and breast feed her baby, and hold him skin to skin, when she visit him in the nNCU I will follow up with mom tomorrow, I told her to skip one pumping tonight, so she can sleep.   Maternal Data    Feeding Feeding Type: Formula Nipple Type: Slow - flow Length of feed: 15 min  LATCH Score                   Interventions    Lactation Tools Discussed/Used Tools: Pump;Flanges;Coconut oil Flange Size: 21 Breast pump type: Double-Electric Breast Pump   Consult Status Consult Status: Follow-up Date: 01/17/17 Follow-up type: In-patient    Brandy Newton, Brandy Newton 01/16/2017, 4:09 PM

## 2017-01-16 NOTE — Anesthesia Postprocedure Evaluation (Signed)
Anesthesia Post Note  Patient: Brandy Newton  Procedure(s) Performed: Procedure(s) (LRB): CESAREAN SECTION (N/A)     Patient location during evaluation: Mother Baby Anesthesia Type: Epidural Level of consciousness: awake and alert and oriented Pain management: pain level controlled Vital Signs Assessment: post-procedure vital signs reviewed and stable Respiratory status: spontaneous breathing and nonlabored ventilation Cardiovascular status: stable Postop Assessment: no headache, no backache, patient able to bend at knees, no signs of nausea or vomiting and adequate PO intake Anesthetic complications: no    Last Vitals:  Vitals:   01/16/17 0210 01/16/17 0530  BP: 125/76   Pulse: 97   Resp: 12 18  Temp: 36.8 C 36.9 C  SpO2:      Last Pain:  Vitals:   01/16/17 0530  TempSrc: Oral  PainSc: 0-No pain   Pain Goal:                 [name]

## 2017-01-16 NOTE — Progress Notes (Signed)
Patient screened out for psychosocial assessment since none of the following apply:  Psychosocial stressors documented in mother or baby's chart  Gestation less than 32 weeks  Code at delivery   Infant with anomalies Please contact the Clinical Social Worker if specific needs arise, or by MOB's request.   Kaylon Hitz Boyd-Gilyard, MSW, LCSW Clinical Social Work [phone]  

## 2017-01-16 NOTE — Progress Notes (Signed)
Brandy Newton [id number]  Subjective: Postpartum Day 1: Primary C/S due to failure to prgress Patient up ad lib, reports no syncope or dizziness. Feeding:  BF   Objective: Temp:  [98.1 F (36.7 C)-99.8 F (37.7 C)] 98.6 F (37 C) (09/01 0915) Pulse Rate:  [77-105] 82 (09/01 0915) Resp:  [12-24] 18 (09/01 0915) BP: (107-153)/(51-81) 132/81 (09/01 0915) SpO2:  [93 %-99 %] 95 % (09/01 0915)  CBC Latest Ref Rng & Units 01/16/2017 01/15/2017 12/28/2016  WBC 4.0 - 10.5 K/uL 13.9(H) 10.2 9.3  Hemoglobin 12.0 - 15.0 g/dL 11.2(L) 11.5(L) 11.9(L)  Hematocrit 36.0 - 46.0 % 33.2(L) 34.3(L) 36.0  Platelets 150 - 400 K/uL 260 298 322     Physical Exam:  General: alert, cooperative, appears stated age and moderately obese Lochia: appropriate Uterine Fundus: firm Abdomen:  + bowel sounds, appropriately tender Incision: Microfoam tape dressing CDI DVT Evaluation: No evidence of DVT seen on physical exam.   Assessment/Plan: Status post cesarean delivery, day 1. Stable Continue current care. Breastfeeding and Circumcision prior to discharge  Desires inpt circ Baby in NICU for BG stabilization    Rhea PinkLori A Lisett Dirusso MSN, CNM 01/16/2017, 11:37 AM

## 2017-01-16 NOTE — Progress Notes (Signed)
Hypoglycemic Event  CBG: 67 Treatment: 4 ounces apple juice  Symptoms: Pt c/o" feeling hot and shaky".  Follow-up CBG: Time:1527 CBG Result:79  Possible Reasons for Event: Unknown  Comments/MD notified: Illene BolusLori  Clemmons CNM notified. No new orders given. Will continue to monitor pt.     Brandy Newton, Brandy Newton

## 2017-01-16 NOTE — Lactation Note (Signed)
This note was copied from a baby's chart. Lactation Consultation Note New mom w/generalized edema. Breast LARGE pendulum breast w/very short shaft nipples. ? Shorter d/t edema. Breast tight and heavy. Breast massaged, hand expression demonstrated w/NO colostrum. Mom stated baby latched in PACU, felt tugging. Breast no compressible, unable to obtain deep latch. Baby blood sugar 25, very jittery. Baby STS. Placed in football position STS, attempted to latch, unable to.  Fitted mom #20 NS, taught application. LC feels that mom will need to be taught again d/t tired and overwhelmed worried about baby. Discussed w/mom since unable to hand express colostrum, baby needs calories to elevate blood sugar, need to give some formula. Mom agreed. Baby took Alimentum well from bottle. Gave 5 ml in NS at breast. Baby BF well w/formula inserted.  Noted dimpling to cheeks. Adjusted mouth, chin tug frequently. Mom stated baby had tight clamp, baby kept closing to smaller flange, adjusted frequently.  Mom held baby STS w/blanket covering. Monitoring blood sugars cont.   Shells given for mom to wear in am. Hand pump given and demonstrated. Nothing obtained.  Mom shown how to use DEBP & how to disassemble, clean, & reassemble parts. Mom knows to pump q3h for 15-20 min. Mom encouraged to feed baby 8-12 times/24 hours and with feeding cues. Educated on Newborn feeding habits, behavior, I&O, STS, cluster feeding, supply and demand. WH/LC brochure given w/resources, support groups and LC services.  Patient Name: Boy Bolivar Hawiesha Kovacich ZOXWR'UToday's Date: 01/16/2017 Reason for consult: Initial assessment;Other (Comment) (hypoglycemia)   Maternal Data Has patient been taught Hand Expression?: Yes Does the patient have breastfeeding experience prior to this delivery?: No  Feeding Feeding Type: Formula Nipple Type: Slow - flow Length of feed: 10 min  LATCH Score Latch: Repeated attempts needed to sustain latch, nipple held in  mouth throughout feeding, stimulation needed to elicit sucking reflex.  Audible Swallowing: None  Type of Nipple: Everted at rest and after stimulation (very short shaft)  Comfort (Breast/Nipple): Filling, red/small blisters or bruises, mild/mod discomfort (non-tender/heavy (probable edema))  Hold (Positioning): Full assist, staff holds infant at breast  LATCH Score: 4  Interventions Interventions: Breast feeding basics reviewed;Support pillows;Assisted with latch;Position options;Skin to skin;Breast massage;Hand express;Shells;Pre-pump if needed;Reverse pressure;Breast compression;Adjust position;Hand pump;DEBP  Lactation Tools Discussed/Used Pump Review: Setup, frequency, and cleaning Initiated by:: Peri JeffersonL. Ryonna Cimini RN IBCLC Date initiated:: 01/16/17   Consult Status Consult Status: Follow-up Date: 01/16/17 Follow-up type: In-patient    Loreley Schwall, Diamond NickelLAURA G 01/16/2017, 12:20 AM

## 2017-01-16 NOTE — Addendum Note (Signed)
Addendum  created 01/16/17 [id number] by Shanon PayorGregory, Mickie Badders M, CRNA   Sign clinical note

## 2017-01-17 LAB — GLUCOSE, CAPILLARY
GLUCOSE-CAPILLARY: 93 mg/dL (ref 65–99)
GLUCOSE-CAPILLARY: 70 mg/dL (ref 65–99)
GLUCOSE-CAPILLARY: 70 mg/dL (ref 65–99)
Glucose-Capillary: 100 mg/dL — ABNORMAL HIGH (ref 65–99)
Glucose-Capillary: 86 mg/dL (ref 65–99)

## 2017-01-17 NOTE — Progress Notes (Signed)
Pt tearful and frustrated with everything that is going on.  She updated me about baby and seizures and she is having difficulty breastfeeding.  She is trying to pump, but it is not working for her and is very frustrated.  Her mother is with her at bedside.  Mom is tearful and appears upset with everything.  We discussed lactation consultant and encouraged her to continue to try; however, if it didn't work for her that she didn't need to blame herself.  Discussed consideration for chaplain and she states that a family pastor is going to try and come by today.   Illene BolusLori Clemmons, CNM and RN informed about patient's emotional status.  Plan to keep a close watch on her today.  Myna HidalgoJennifer Azazel Franze, DO [phone] (pager) [phone] (office)

## 2017-01-17 NOTE — Progress Notes (Signed)
Brandy Newton [id number]  Subjective: Postpartum Day 2: Primary C/S  Patient up ad lib, reports no syncope or dizziness. Feeding:  Pumping Per my conversation with the patient, she is very emotional and upset because the baby started having seizures last night. She said she witnessed one of the seizures. She is frustrated about her milk not coming in yet.   Objective: Temp:  [97.8 F (36.6 C)-98 F (36.7 C)] 98 F (36.7 C) (09/02 0457) Pulse Rate:  [75-80] 80 (09/02 0457) Resp:  [18] 18 (09/02 0457) BP: (127-144)/(76-83) 144/83 (09/02 0457) SpO2:  [99 %] 99 % (09/01 1830)  CBC Latest Ref Rng & Units 01/16/2017 01/15/2017 12/28/2016  WBC 4.0 - 10.5 K/uL 13.9(H) 10.2 9.3  Hemoglobin 12.0 - 15.0 g/dL 11.2(L) 11.5(L) 11.9(L)  Hematocrit 36.0 - 46.0 % 33.2(L) 34.3(L) 36.0  Platelets 150 - 400 K/uL 260 298 322     Physical Exam:  General: alert, cooperative and emotional about baby's condition Lochia: appropriate Uterine Fundus: firm Abdomen:  + bowel sounds, nontender Incision: Microfoam dressing CDI DVT Evaluation: No evidence of DVT seen on physical exam.   Assessment/Plan: Status post cesarean delivery, day 2. Stable Continue current care. Discussed starting Zoloft and pt will consider Possible Rooming in Social Work came to speak to pt while I was in the room  Plan for discharge tomorrow, Breastfeeding, Lactation consult and Social Work consult    Rhea PinkLori A Clemmons MSN, CNM 01/17/2017, 2:35 PM

## 2017-01-17 NOTE — Clinical Social Work Maternal (Signed)
CLINICAL SOCIAL WORK MATERNAL/CHILD NOTE  Patient Details  Name: Brandy Newton MRN: [id number] Date of Birth: 11/27/1985  Date:  01/17/2017  Clinical Social Worker Initiating Note:  Kevin Space, LCSW Date/ Time Initiated:  01/17/17/1430     Child's Name:  Brandy Newton Jr.   Legal Guardian:  Other (Comment) (Parents: Brandy Newton and Brandy Newton)   Need for Interpreter:  None   Date of Referral:   (No referral-NICU admission)     Reason for Referral:      Referral Source:      Address:  [address] [address] [place], [place] [address]  Phone number:  [phone]   Household Members:  Significant Other   Natural Supports (not living in the home):  Friends, Immediate Family, Extended Family, Parent (MOB reports that she has a great support system of family and friends.)   Professional Supports: Therapist (Fatima Jordan/counselor)   Employment: Full-time, Student   Type of Work: 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   Education:  Graduate degree   Financial Resources:  Private Insurance   Other Resources:      Cultural/Religious Considerations Which May Impact Care: None stated.  Strengths:  Ability to meet basic needs , Pediatrician chosen , Compliance with medical plan , Understanding of illness, Home prepared for child  (Pediatric follow up will be with Dr. Miller)   Risk Factors/Current Problems:  Mental Health Concerns  (Maternal hx of Anx/Dep)   Cognitive State:  Alert , Able to Concentrate , Insightful , Linear Thinking , Goal Oriented    Mood/Affect:  Interested , Calm    CSW Assessment: CSW met with MOB in her first floor room/133 to offer support and complete assessment due to baby's admission to NICU.  Baby was initially screened out for CSW involvement, but when baby began having seizure-like activity, CSW wanted to follow up for support.   FOB was asleep on the couch.  MOB was  pleasant and welcoming.  She was open to talking about her emotions and states they have had a "rough night."  CSW validated her feelings and provided supportive brief counseling as she shared her story and processed her feelings related to baby's admission to NICU and subsequent seizure activity.  She appears very tired and states she has not gotten much rest.  She is thankful for the staff caring for baby and that if this was going to happen, that it was identified in the hospital and that baby is receiving the treatment that he needs.  She reports that FOB and their families have been hugely supportive and that they are taking this one step at a time.   MOB reports that in addition to baby's admission to NICU, she is stressed about a lack of milk supply, and the fact that FOB will need to return to [place] for school within the next two weeks.  She states the plan was for her to stay in [place] with the baby where all of their family support is when he returns to school.  She states they are re-thinking this plan "given this scare."  She states that if she and the baby go to [place] with FOB, they will be isolated from supports, but their immediate family will be together.  CSW encouraged MOB not to make any major decisions right now until they feel rested and more is known about the baby's medical status.  She agreed   and thanked CSW for that encouragement.   MOB states she has a Masters in Social Work and is aware of the importance of monitoring mental health.  She states she has a hx of Anxiety and Depression and sees a counselor, though has not been in "awhile."  CSW encouraged her to make contact with her counselor soon given that the postpartum time period is fragile and that she has heightened risk of PMADs given hx and baby's admission to NICU.  MOB agrees that this is a good plan.  CSW reviewed signs and symptoms.  MOB states she has spoken with her CNM about starting Zoloft, but she does not  want to take medication unless it is necessary.  CSW agrees with MOB, but ensured that she is aware that an antidepressant can take 4-6 weeks to reach a therapeutic level in the blood stream.  MOB stated understanding and asked if Zoloft is safe in breast feeding.  CSW states that it is and encouraged her to speak with a lactation consultant for more information if she would like. CSW provided encouragement regarding breast feeding/pumping and normalized MOB's stated frustrations.  CSW suggests she give it her best effort, asking for help when needed, and let it go if it becomes too stressful at any time.  MOB seemed discouraged about pumping and states since baby has already gotten formula, maybe she shouldn't try to pump.  CSW encouraged her that it is not too late to provide breast milk and that it is her choice, but to not determine she isn't going to breast feed just because baby has already had formula.  MOB stated understanding and appreciation.  She requests assistance from lactation at this time.  CSW notified LC upon leaving the room. CSW asked MOB to call CSW if she feels she would like to process her emotions at any time or if she has questions she does not know where to direct.  CSW provided contact information.  MOB was very appreciative of the visit, thanking CSW.    CSW Plan/Description:  Patient/Family Education , Psychosocial Support and Ongoing Assessment of Needs    Mordche Hedglin Elizabeth, LCSW 01/17/2017, 3:38 PM 

## 2017-01-17 NOTE — Lactation Note (Signed)
This note was copied from a baby's chart. Lactation Consultation Note  Patient Name: Brandy Newton Reason for consult: Follow-up assessment;NICU baby, full term, to NICU for hypoglycemia, and then developed seizures. Mom has had a very tearful day. I was able to review hand expression, and collect about 0.5 ml of colostrum/transitional milk. Mom was very happy, relieved that her milk is beginning to transition in. I assured mom that she is just at 43 hours post partum, and that her milk will come  in between 48-72,hours. I advised mom to keep doing what she is doing, and that tit takes a full 14 days to get to full volume of milk per day. I told mom that lactation will see her tomorrow, and mom does have a DEP at home.    Maternal Data    Feeding Feeding Type: Formula Nipple Type: Slow - flow Length of feed: 20 min  LATCH Score                   Interventions    Lactation Tools Discussed/Used     Consult Status Consult Status: Follow-up Date: 01/18/17 Follow-up type: In-patient    Alfred LevinsLee, Teighlor Korson Anne Newton, 4:13 PM

## 2017-01-18 DIAGNOSIS — Z98891 History of uterine scar from previous surgery: Secondary | ICD-10-CM

## 2017-01-18 NOTE — Lactation Note (Signed)
This note was copied from a baby's chart. Lactation Consultation Note  Patient Name: Brandy Newton ZOXWR'UToday's Date: 01/18/2017 Reason for consult: Follow-up assessment;NICU baby   Follow-up at 61 hrs with mom of NICU baby.  GA 38.2; BW 8 lbs, 3.6 oz.  C-section on 8/31.  P1.   Baby in NICU for hypoglycemia and seizures.  Mom has very large breast with everted nipples.  LC can feel some breast changes beginning to occur. Mom concerned because she has not been able to pump any milk or hand express any milk.  Yesterday was able to hand express .5 ml.   Mom demonstrated hand expression but was doing it too fast to allow flow of milk.   LC reviewed hand expression and gave extra tips colostrum beginning to glisten on tip of nipple but was not able to get a drop to collect.   Encouraged using hands-on pumping with hand expression at end of pumping session for an additional 10-15 minutes after using "initiate" setting.  Encouraged mom to continue pumping every 2-3 hrs for at least 8-12 times per day.   Encouragement given to continue with the routine of pumping/ hand expression and milk should begin to flow especially since [facility] can feel breast changes. Parents very receptive to reinforcement teaching and encouragement.   Mom has DEBP at home she plans to use after discharge.      Maternal Data Has patient been taught Hand Expression?: Yes (with return demonstration and observation of colostrum glistening tip of nipple)  Feeding Feeding Type: Bottle Fed - Formula Nipple Type: Slow - flow  LATCH Score       Type of Nipple: Everted at rest and after stimulation  Comfort (Breast/Nipple): Soft / non-tender        Interventions    Lactation Tools Discussed/Used     Consult Status Consult Status: Follow-up Date: 01/19/17 Follow-up type: In-patient    Brandy Newton, Brandy Newton 01/18/2017, 10:01 AM

## 2017-01-18 NOTE — Progress Notes (Signed)
Brandy Newton [id number]  Subjective: Postpartum Day 3: Primary LTC/S due to FTP Patient up with assistance, ambulated x 1 to NICU yesterday.  Reports incisional pain, side muscle pain, swelling, and tingling in right hand and 4th and 5th toe on right foot.  Taking Ibuprophen and Oxycodone with benefit. Feeding:  Pumping for breastmilk Contraceptive plan:  Undecided, will decide later Passing flatus  Baby in NICU--had initial hypoglycemia, then had seizure activity.  On Keppra, no further seizures.  Sepsis w/u in process, on ATB.  Repeat EEG planned in 5 days, then MRI.  Family does plan circumcision when appropriate.  Declines Zoloft at present.  Feels she has good support at present.  Objective: Temp:  [98.2 F (36.8 C)-98.4 F (36.9 C)] 98.2 F (36.8 C) (09/03 0500) Pulse Rate:  [85-88] 85 (09/03 0500) Resp:  [18] 18 (09/03 0500) BP: (133-148)/(74-77) 133/74 (09/03 0500)   Vitals:   01/16/17 1830 01/17/17 0457 01/17/17 1922 01/18/17 0500  BP: 127/76 (!) 144/83 (!) 148/77 133/74  Pulse: 75 80 88 85  Resp: 18 18 18 18   Temp: 97.8 F (36.6 C) 98 F (36.7 C) 98.4 F (36.9 C) 98.2 F (36.8 C)  TempSrc: Oral Oral Oral   SpO2: 99%     Weight:      Height:        CBC Latest Ref Rng & Units 01/16/2017 01/15/2017 12/28/2016  WBC 4.0 - 10.5 K/uL 13.9(H) 10.2 9.3  Hemoglobin 12.0 - 15.0 g/dL 11.2(L) 11.5(L) 11.9(L)  Hematocrit 36.0 - 46.0 % 33.2(L) 34.3(L) 36.0  Platelets 150 - 400 K/uL 260 298 322   CBG (last 3)   Recent Labs  01/17/17 1339 01/17/17 1829 01/17/17 2319  GLUCAP 93 70 70     Physical Exam:  General: alert, anxious Lochia: appropriate Uterine Fundus: firm Abdomen:  + bowel sounds, mild tympanic distension Incision: Honeycomb dressing CDI DVT Evaluation: No evidence of DVT seen on physical exam, DTR 1+, 1+ edema in LE.  Full ROM all extremities, normal sensation to touch in hands and feet.   Assessment/Plan: Status post cesarean delivery, day 3--FTP,  GDM, NICU baby  Incomplete pain control Stable CBGs stable Continue current care. Will plan d/c tomorrow--this will allow for better assessment of pain management, breast feeding support, reassessment of status. Get circ permit signed prior to d/c, send to baby's chart in NICU.   Nigel BridgemanLATHAM, Brandy Jarboe MSN, CNM 01/18/2017, 8:02 AM

## 2017-01-18 NOTE — Progress Notes (Signed)
Pt c/o of occasional "numbness"  In the 4th and 5th toe on right foot- states mostly when walking. Edema noted in feet and lower legs bil but are warm to touch with + pedal pulse. Instructed pt to notify MD during AM rounds.

## 2017-01-19 LAB — GLUCOSE, CAPILLARY: Glucose-Capillary: 93 mg/dL (ref 65–99)

## 2017-01-19 MED ORDER — OXYCODONE HCL 10 MG PO TABS: 10.0000 mg | ORAL_TABLET | ORAL | 0 refills | Status: DC | PRN

## 2017-01-19 NOTE — Discharge Summary (Signed)
OB Discharge Summary     Patient Name: Brandy Newton DOB: 1985/07/15 MRN: [id number]  Date of admission: 01/15/2017 Delivering MD: Myna Hidalgo   Date of discharge: 01/19/2017  Admitting diagnosis: 38 WEEKS CTX ROM Intrauterine pregnancy: [redacted]w[redacted]d     Secondary diagnosis:  Principal Problem:   Status post primary low transverse cesarean section  Additional problems:Infant in NICU with Seizures     Discharge diagnosis: Term Pregnancy Delivered                                                                                                Post partum procedures:None  Augmentation: Pitocin  Complications: None  Hospital course:  Onset of Labor With Unplanned C/S  31 y.o. yo G1P1001 at [redacted]w[redacted]d was admitted in Latent Labor on 01/15/2017. Patient had a labor course significant for fetal intolerance to labor, and arrest of dialation. Membrane Rupture Time/Date: 2:30 AM ,01/15/2017   The patient went for cesarean section due to Arrest of Dilation and Non-Reassuring FHR, and delivered a Viable infant,01/15/2017  Details of operation can be found in separate operative note. Patient had an uncomplicated postpartum course.  She is ambulating,tolerating a regular diet, passing flatus, and urinating well.  Patient is discharged home in stable condition 01/19/17.  Physical exam  Vitals:   01/17/17 1922 01/18/17 0500 01/18/17 2041 01/19/17 0553  BP: (!) 148/77 133/74 118/64 134/83  Pulse: 88 85 79 64  Resp: 18 18 18 18   Temp: 98.4 F (36.9 C) 98.2 F (36.8 C) 98.6 F (37 C) 98.2 F (36.8 C)  TempSrc: Oral  Oral Oral  SpO2:      Weight:      Height:       General: alert, cooperative and no distress Lochia: appropriate Uterine Fundus: firm Incision: Dressing is clean, dry, and intact DVT Evaluation: No evidence of DVT seen on physical exam. Negative Homan's sign. Labs: Lab Results  Component Value Date   WBC 13.9 (H) 01/16/2017   HGB 11.2 (L) 01/16/2017   HCT 33.2 (L) 01/16/2017   MCV 86.7 01/16/2017   PLT 260 01/16/2017   CMP Latest Ref Rng & Units 12/28/2016  Glucose 65 - 99 mg/dL 80  BUN 6 - 20 mg/dL 9  Creatinine 1.91 - 4.78 mg/dL 2.95  Sodium 621 - 308 mmol/L 134(L)  Potassium 3.5 - 5.1 mmol/L 3.9  Chloride 101 - 111 mmol/L 105  CO2 22 - 32 mmol/L 19(L)  Calcium 8.9 - 10.3 mg/dL 9.3  Total Protein 6.5 - 8.1 g/dL 6.7  Total Bilirubin 0.3 - 1.2 mg/dL 0.7  Alkaline Phos 38 - 126 U/L 94  AST 15 - 41 U/L 17  ALT 14 - 54 U/L 13(L)    Discharge instruction: per After Visit Summary and "Baby and Me Booklet".  After visit meds:  PNV, Ibuprofen, Percocet  Diet: routine diet  Activity: Advance as tolerated. Pelvic rest for 6 weeks.   Outpatient follow up:6 weeks Follow up Appt:No future appointments. Follow up Visit:No Follow-up on file.  Postpartum contraception: Undecided  Newborn Data: Live born female  Birth Weight: 8 lb 3.6  oz (3730 g) APGAR: 2, 8  Baby Feeding: Breast Disposition:NICU   01/19/2017 Kenney HousemanNancy Jean Prothero, CNM

## 2017-01-19 NOTE — Lactation Note (Signed)
This note was copied from a baby's chart. Lactation Consultation Note  Patient Name: Brandy Newton Reason for consult: Follow-up assessment  NICU baby 5784 hours old. Mom reports that she is still just getting clear drops of EBM. Discussed progression of milk coming to volume and enc continuing to pump every 2-3 hours followed by hand expression. Discussed benefits of hospital-grade pump and enc mom to pump in NICU pumping rooms. Mom states that she has a Medela DEBP at home. Mom aware of OP/BFSG and LC phone line assistance after D/C.   Maternal Data    Feeding Feeding Type: Formula Nipple Type: Slow - flow Length of feed: 30 min  LATCH Score                   Interventions    Lactation Tools Discussed/Used     Consult Status Consult Status: PRN    Brandy Newton Newton, 8:57 AM

## 2017-01-20 ENCOUNTER — Ambulatory Visit: Payer: Self-pay

## 2017-01-20 ENCOUNTER — Inpatient Hospital Stay (HOSPITAL_COMMUNITY): Admission: RE | Admit: 2017-01-20 | Payer: BLUE CROSS/BLUE SHIELD | Source: Ambulatory Visit

## 2017-01-20 NOTE — Lactation Note (Signed)
This note was copied from a baby's chart. Lactation Consultation Note  Patient Name: Brandy Newton ZOXWR'UToday's Date: 01/20/2017 Reason for consult: Follow-up assessment;Engorgement  NICU baby 425 days old. Came to assist mom with latching baby, but mom's breasts are engorged. Mom states that she has not really been able to express more than drops from each breast the entire time she has been pumping. Assisted mom to ice breasts, and discussed engorgement prevention/treatment measures. Demonstrated how to use manual pump, hand expression and DEBP to remove milk. Refitted mom with #24 flanges, and mom able to express 1-2 ml of EBM from right breast and flange moist on left breast. Mom going to visit with baby, so refreshed ice bags and enc placing under her shirt while visiting with the baby. Discussed using manual pump at bedside as well. Enc mom to get comfortable at home and use bags of frozen vegetables on breasts. Enc mom to work on breasts until softened. Mom reports increased comfort with icing and relief that even a little milk if flowing. Enc mom to call as needed.   Maternal Data    Feeding Feeding Type: Formula Nipple Type: Slow - flow Length of feed: 25 min  LATCH Score                   Interventions Interventions: Ice;Hand pump;Breast massage  Lactation Tools Discussed/Used Tools: Flanges Flange Size: 24   Consult Status Consult Status: PRN    Sherlyn HayJennifer D Daquana Paddock 01/20/2017, 3:00 PM

## 2017-01-21 DIAGNOSIS — R569 Unspecified convulsions: Secondary | ICD-10-CM | POA: Diagnosis not present

## 2017-03-01 ENCOUNTER — Encounter (HOSPITAL_COMMUNITY): Payer: Self-pay | Admitting: *Deleted

## 2017-03-11 ENCOUNTER — Telehealth: Payer: Self-pay | Admitting: Obstetrics & Gynecology

## 2017-03-11 NOTE — Telephone Encounter (Signed)
Pt called back- reviewed her concerns regarding her procedure.  She addressed her anxiety and feeling overwhelmed during the surgery.  And I expressed my apologies and told her that sometimes we forget that while this is our day to day, it can be very overwhelming for patients and sometimes we need to be reminded of that.  She was very appreciative and thankful that both she and her baby are here and are now working on fixing his inguinal hernia.  I told patient that I was glad that her and baby were doing well and that should she ever need anything, she can always reach out to our office

## 2017-03-17 DIAGNOSIS — F53 Postpartum depression: Secondary | ICD-10-CM | POA: Diagnosis not present

## 2017-05-15 DIAGNOSIS — F330 Major depressive disorder, recurrent, mild: Secondary | ICD-10-CM | POA: Diagnosis not present

## 2017-06-09 DIAGNOSIS — M7671 Peroneal tendinitis, right leg: Secondary | ICD-10-CM | POA: Diagnosis not present

## 2017-06-09 DIAGNOSIS — M25571 Pain in right ankle and joints of right foot: Secondary | ICD-10-CM | POA: Diagnosis not present

## 2017-06-10 DIAGNOSIS — S86312A Strain of muscle(s) and tendon(s) of peroneal muscle group at lower leg level, left leg, initial encounter: Secondary | ICD-10-CM | POA: Diagnosis not present

## 2017-06-10 DIAGNOSIS — S93401A Sprain of unspecified ligament of right ankle, initial encounter: Secondary | ICD-10-CM | POA: Diagnosis not present

## 2017-07-16 DIAGNOSIS — H5213 Myopia, bilateral: Secondary | ICD-10-CM | POA: Diagnosis not present

## 2017-09-06 DIAGNOSIS — Z8632 Personal history of gestational diabetes: Secondary | ICD-10-CM | POA: Diagnosis not present

## 2017-09-06 DIAGNOSIS — N760 Acute vaginitis: Secondary | ICD-10-CM | POA: Diagnosis not present

## 2017-09-06 DIAGNOSIS — Z1151 Encounter for screening for human papillomavirus (HPV): Secondary | ICD-10-CM | POA: Diagnosis not present

## 2017-09-06 DIAGNOSIS — Z124 Encounter for screening for malignant neoplasm of cervix: Secondary | ICD-10-CM | POA: Diagnosis not present

## 2017-09-06 DIAGNOSIS — Z01419 Encounter for gynecological examination (general) (routine) without abnormal findings: Secondary | ICD-10-CM | POA: Diagnosis not present

## 2018-06-08 ENCOUNTER — Ambulatory Visit: Payer: BLUE CROSS/BLUE SHIELD | Admitting: Women's Health

## 2018-06-08 ENCOUNTER — Encounter: Payer: Self-pay | Admitting: Women's Health

## 2018-06-08 VITALS — BP 122/80 | Ht 65.0 in | Wt 204.0 lb

## 2018-06-08 DIAGNOSIS — Z3009 Encounter for other general counseling and advice on contraception: Secondary | ICD-10-CM | POA: Diagnosis not present

## 2018-06-08 DIAGNOSIS — Z8632 Personal history of gestational diabetes: Secondary | ICD-10-CM

## 2018-06-08 NOTE — Progress Notes (Signed)
33 yo M BF G1P1 presents to discuss birth control options. Regular monthly cycles, no contraception at this time. History of gestational diabetes, cesarean section 12/2016, son now doing well was in the NICU for 2 weeks with seizures.  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  [place], not desiring second child for at least 2 years.  History of migraines with OCs in the past, 20 pounds higher than prepregnant weight.  Normal Pap with negative HR HPV 2019 at annual exam with OB 08/2017, hemoglobin A1c 5.4.  States is changing back to our practice. No complaints of any health concerns today of vaginal discharge, urinary symptoms, abdominal pain or fever.  Exam: Appears Well obese.  Contraception Management Obesity   Plan: Discussed different birth control options. Nexplanon was preferred method. Discussed side effects, instructions of placement and how it worked. Decided to schedule appointment with Dr. Seymour BarsLavoie to have placed. Encouraged low carbohydrate diet and increase in physical activity. Discussed packing lunch and meal prepping in order to consume less calories and carbohydrates.  Congratulations given on birth of son.

## 2018-06-08 NOTE — Patient Instructions (Addendum)
Etonogestrel implant What is this medicine? ETONOGESTREL (et oh noe JES trel) is a contraceptive (birth control) device. It is used to prevent pregnancy. It can be used for up to 3 years. This medicine may be used for other purposes; ask your health care provider or pharmacist if you have questions. COMMON BRAND NAME(S): Implanon, Nexplanon What should I tell my health care provider before I take this medicine? They need to know if you have any of these conditions: -abnormal vaginal bleeding -blood vessel disease or blood clots -breast, cervical, endometrial, ovarian, liver, or uterine cancer -diabetes -gallbladder disease -heart disease or recent heart attack -high blood pressure -high cholesterol or triglycerides -kidney disease -liver disease -migraine headaches -seizures -stroke -tobacco smoker -an unusual or allergic reaction to etonogestrel, anesthetics or antiseptics, other medicines, foods, dyes, or preservatives -pregnant or trying to get pregnant -breast-feeding How should I use this medicine? This device is inserted just under the skin on the inner side of your upper arm by a health care professional. Talk to your pediatrician regarding the use of this medicine in children. Special care may be needed. Overdosage: If you think you have taken too much of this medicine contact a poison control center or emergency room at once. NOTE: This medicine is only for you. Do not share this medicine with others. What if I miss a dose? This does not apply. What may interact with this medicine? Do not take this medicine with any of the following medications: -amprenavir -fosamprenavir This medicine may also interact with the following medications: -acitretin -aprepitant -armodafinil -bexarotene -bosentan -carbamazepine -certain medicines for fungal infections like fluconazole, ketoconazole, itraconazole and voriconazole -certain medicines to treat hepatitis, HIV or  AIDS -cyclosporine -felbamate -griseofulvin -lamotrigine -modafinil -oxcarbazepine -phenobarbital -phenytoin -primidone -rifabutin -rifampin -rifapentine -St. John's wort -topiramate This list may not describe all possible interactions. Give your health care provider a list of all the medicines, herbs, non-prescription drugs, or dietary supplements you use. Also tell them if you smoke, drink alcohol, or use illegal drugs. Some items may interact with your medicine. What should I watch for while using this medicine? This product does not protect you against HIV infection (AIDS) or other sexually transmitted diseases. You should be able to feel the implant by pressing your fingertips over the skin where it was inserted. Contact your doctor if you cannot feel the implant, and use a non-hormonal birth control method (such as condoms) until your doctor confirms that the implant is in place. Contact your doctor if you think that the implant may have broken or become bent while in your arm. You will receive a user card from your health care provider after the implant is inserted. The card is a record of the location of the implant in your upper arm and when it should be removed. Keep this card with your health records. What side effects may I notice from receiving this medicine? Side effects that you should report to your doctor or health care professional as soon as possible: -allergic reactions like skin rash, itching or hives, swelling of the face, lips, or tongue -breast lumps, breast tissue changes, or discharge -breathing problems -changes in emotions or moods -if you feel that the implant may have broken or bent while in your arm -high blood pressure -pain, irritation, swelling, or bruising at the insertion site -scar at site of insertion -signs of infection at the insertion site such as fever, and skin redness, pain or discharge -signs and symptoms of a blood clot such   as breathing  problems; changes in vision; chest pain; severe, sudden headache; pain, swelling, warmth in the leg; trouble speaking; sudden numbness or weakness of the face, arm or leg -signs and symptoms of liver injury like dark yellow or brown urine; general ill feeling or flu-like symptoms; light-colored stools; loss of appetite; nausea; right upper belly pain; unusually weak or tired; yellowing of the eyes or skin -unusual vaginal bleeding, discharge Side effects that usually do not require medical attention (report to your doctor or health care professional if they continue or are bothersome): -acne -breast pain or tenderness -headache -irregular menstrual bleeding -nausea This list may not describe all possible side effects. Call your doctor for medical advice about side effects. You may report side effects to FDA at 1-800-FDA-1088. Where should I keep my medicine? This drug is given in a hospital or clinic and will not be stored at home. NOTE: This sheet is a summary. It may not cover all possible information. If you have questions about this medicine, talk to your doctor, pharmacist, or health care provider.  2019 Elsevier/Gold Standard (2017-03-23 14:11:42)  Carbohydrate Counting for Diabetes Mellitus, Adult  Carbohydrate counting is a method of keeping track of how many carbohydrates you eat. Eating carbohydrates naturally increases the amount of sugar (glucose) in the blood. Counting how many carbohydrates you eat helps keep your blood glucose within normal limits, which helps you manage your diabetes (diabetes mellitus). It is important to know how many carbohydrates you can safely have in each meal. This is different for every person. A diet and nutrition specialist (registered dietitian) can help you make a meal plan and calculate how many carbohydrates you should have at each meal and snack. Carbohydrates are found in the following foods:  Grains, such as breads and cereals.  Dried beans  and soy products.  Starchy vegetables, such as potatoes, peas, and corn.  Fruit and fruit juices.  Milk and yogurt.  Sweets and snack foods, such as cake, cookies, candy, chips, and soft drinks. How do I count carbohydrates? There are two ways to count carbohydrates in food. You can use either of the methods or a combination of both. Reading "Nutrition Facts" on packaged food The "Nutrition Facts" list is included on the labels of almost all packaged foods and beverages in the [place] It includes:  The serving size.  Information about nutrients in each serving, including the grams (g) of carbohydrate per serving. To use the "Nutrition Facts":  Decide how many servings you will have.  Multiply the number of servings by the number of carbohydrates per serving.  The resulting number is the total amount of carbohydrates that you will be having. Learning standard serving sizes of other foods When you eat carbohydrate foods that are not packaged or do not include "Nutrition Facts" on the label, you need to measure the servings in order to count the amount of carbohydrates:  Measure the foods that you will eat with a food scale or measuring cup, if needed.  Decide how many standard-size servings you will eat.  Multiply the number of servings by 15. Most carbohydrate-rich foods have about 15 g of carbohydrates per serving. ? For example, if you eat 8 oz (170 g) of strawberries, you will have eaten 2 servings and 30 g of carbohydrates (2 servings x 15 g = 30 g).  For foods that have more than one food mixed, such as soups and casseroles, you must count the carbohydrates in each food that is included.  The following list contains standard serving sizes of common carbohydrate-rich foods. Each of these servings has about 15 g of carbohydrates:   hamburger bun or  English muffin.   oz (15 mL) syrup.   oz (14 g) jelly.  1 slice of bread.  1 six-inch tortilla.  3 oz (85 g) cooked rice  or pasta.  4 oz (113 g) cooked dried beans.  4 oz (113 g) starchy vegetable, such as peas, corn, or potatoes.  4 oz (113 g) hot cereal.  4 oz (113 g) mashed potatoes or  of a large baked potato.  4 oz (113 g) canned or frozen fruit.  4 oz (120 mL) fruit juice.  4-6 crackers.  6 chicken nuggets.  6 oz (170 g) unsweetened dry cereal.  6 oz (170 g) plain fat-free yogurt or yogurt sweetened with artificial sweeteners.  8 oz (240 mL) milk.  8 oz (170 g) fresh fruit or one small piece of fruit.  24 oz (680 g) popped popcorn. Example of carbohydrate counting Sample meal  3 oz (85 g) chicken breast.  6 oz (170 g) brown rice.  4 oz (113 g) corn.  8 oz (240 mL) milk.  8 oz (170 g) strawberries with sugar-free whipped topping. Carbohydrate calculation 1. Identify the foods that contain carbohydrates: ? Rice. ? Corn. ? Milk. ? Strawberries. 2. Calculate how many servings you have of each food: ? 2 servings rice. ? 1 serving corn. ? 1 serving milk. ? 1 serving strawberries. 3. Multiply each number of servings by 15 g: ? 2 servings rice x 15 g = 30 g. ? 1 serving corn x 15 g = 15 g. ? 1 serving milk x 15 g = 15 g. ? 1 serving strawberries x 15 g = 15 g. 4. Add together all of the amounts to find the total grams of carbohydrates eaten: ? 30 g + 15 g + 15 g + 15 g = 75 g of carbohydrates total. Summary  Carbohydrate counting is a method of keeping track of how many carbohydrates you eat.  Eating carbohydrates naturally increases the amount of sugar (glucose) in the blood.  Counting how many carbohydrates you eat helps keep your blood glucose within normal limits, which helps you manage your diabetes.  A diet and nutrition specialist (registered dietitian) can help you make a meal plan and calculate how many carbohydrates you should have at each meal and snack. This information is not intended to replace advice given to you by your health care provider. Make sure you  discuss any questions you have with your health care provider. Document Released: 05/04/2005 Document Revised: 11/11/2016 Document Reviewed: 10/16/2015 Elsevier Interactive Patient Education  2019 [employer].

## 2018-06-21 ENCOUNTER — Ambulatory Visit (INDEPENDENT_AMBULATORY_CARE_PROVIDER_SITE_OTHER): Payer: BLUE CROSS/BLUE SHIELD | Admitting: Obstetrics & Gynecology

## 2018-06-21 ENCOUNTER — Encounter: Payer: Self-pay | Admitting: Obstetrics & Gynecology

## 2018-06-21 VITALS — BP 130/78

## 2018-06-21 DIAGNOSIS — Z30017 Encounter for initial prescription of implantable subdermal contraceptive: Secondary | ICD-10-CM

## 2018-06-21 NOTE — Progress Notes (Signed)
    Brandy Newton Feb 07, 1986 [id number]        33 y.o.  G1P1001 Married.  64 month old son  RP: Nexplanon counseling and insertion  HPI: Menses regular normal every month.  LMP normal 06/16/2018.  No pelvic pain.     OB History  Gravida Para Term Preterm AB Living  1 1 1    0 1  SAB TAB Ectopic Multiple Live Births      0 0 1    # Outcome Date GA Lbr Len/2nd Weight Sex Delivery Anes PTL Lv  1 Term 01/15/17 [redacted]w[redacted]d  8 lb 3.6 oz (3.73 kg) M CS-LTranv EPI  LIV    Past medical history,surgical history, problem list, medications, allergies, family history and social history were all reviewed and documented in the EPIC chart.   Directed ROS with pertinent positives and negatives documented in the history of present illness/assessment and plan.  Exam:  Vitals:   06/21/18 0912  BP: 130/78   General appearance:  Normal                                              Nexplanon Procedure Note (insertion)   The patient was laying on her back with her nondominant arm flexed at the elbow and externally rotated. The insertion site was identified as the underside of the nondominant upper arm approximately 8 cm from the medial epicondyle of the humerus.  A mark was made with a sterile marker at the spot where the Nexplanon implant will be inserted. The area was cleansed with Betadine solution. The area was anesthetized with 1% lidocaine  (1 cc)  at the area the injection site and underneath the skin along the planned insertion tunnel. The preloaded disposable Nexplanon was removed from its sterile casing.  The applicator was held above the needle at the textured surface area. The transparent protector was removed. With a freehand, the skin was stretched around the insertion site with a thumb and index finger. The skin was then punctured with the tip of the needle angled at 30. The Nexplanon applicator was lowered to a horizontal position. While lifting the skin with the tip of the needle the needle was  then slid to its full length. The applicator was kept in this position with the needle inserted to its full length. The purple slider was unlocked by pushing it slightly downward. The slider was fully moved back until it stopped. This allowed the implant to be in the final subdermal position and the needle to be locked inside the body of the applicator. The applicator was then removed. 3 Steri-Strips were applied over the incision, a band-aid and a bandage was placed which the patient is to remove tomorrow. No complications, the patient tolerated procedure well and was released home with instructions.   Assessment/Plan:  33 y.o. G1P1001   1. Encounter for initial prescription of Nexplanon Counseling on Nexplanon done.  Insertion procedure, risks and benefits reviewed with patient.  Questions answered.  2. Nexplanon insertion Easy insertion of Nexplanon and the left nondominant arm.  Easy insertion, well-tolerated and no complication.  Post insertion precautions discussed.  Counseling on above issues and coordination of care more than 50% for 15 minutes.  Genia Del MD, 9:23 AM 06/21/2018

## 2018-06-21 NOTE — Patient Instructions (Signed)
1. Encounter for initial prescription of Nexplanon Counseling on Nexplanon done.  Insertion procedure, risks and benefits reviewed with patient.  Questions answered.  2. Nexplanon insertion Easy insertion of Nexplanon and the left nondominant arm.  Easy insertion, well-tolerated and no complication.  Post insertion precautions discussed.  Brandy Newton, it was a pleasure meeting you today!

## 2018-07-07 ENCOUNTER — Encounter: Payer: Self-pay | Admitting: Anesthesiology

## 2018-09-17 DIAGNOSIS — J069 Acute upper respiratory infection, unspecified: Secondary | ICD-10-CM | POA: Diagnosis not present

## 2018-09-17 DIAGNOSIS — Z03818 Encounter for observation for suspected exposure to other biological agents ruled out: Secondary | ICD-10-CM | POA: Diagnosis not present

## 2018-09-17 DIAGNOSIS — R432 Parageusia: Secondary | ICD-10-CM | POA: Diagnosis not present

## 2018-09-17 DIAGNOSIS — R05 Cough: Secondary | ICD-10-CM | POA: Diagnosis not present

## 2018-10-03 DIAGNOSIS — F4322 Adjustment disorder with anxiety: Secondary | ICD-10-CM | POA: Diagnosis not present

## 2018-10-13 DIAGNOSIS — F4322 Adjustment disorder with anxiety: Secondary | ICD-10-CM | POA: Diagnosis not present

## 2018-11-01 DIAGNOSIS — M7651 Patellar tendinitis, right knee: Secondary | ICD-10-CM | POA: Diagnosis not present

## 2018-11-01 DIAGNOSIS — M778 Other enthesopathies, not elsewhere classified: Secondary | ICD-10-CM | POA: Diagnosis not present

## 2018-11-01 DIAGNOSIS — M722 Plantar fascial fibromatosis: Secondary | ICD-10-CM | POA: Diagnosis not present

## 2018-11-02 DIAGNOSIS — F4322 Adjustment disorder with anxiety: Secondary | ICD-10-CM | POA: Diagnosis not present

## 2018-11-08 DIAGNOSIS — F4322 Adjustment disorder with anxiety: Secondary | ICD-10-CM | POA: Diagnosis not present

## 2018-11-22 DIAGNOSIS — F4322 Adjustment disorder with anxiety: Secondary | ICD-10-CM | POA: Diagnosis not present

## 2018-11-30 DIAGNOSIS — J3489 Other specified disorders of nose and nasal sinuses: Secondary | ICD-10-CM | POA: Diagnosis not present

## 2018-11-30 DIAGNOSIS — J069 Acute upper respiratory infection, unspecified: Secondary | ICD-10-CM | POA: Diagnosis not present

## 2018-12-06 DIAGNOSIS — F4322 Adjustment disorder with anxiety: Secondary | ICD-10-CM | POA: Diagnosis not present

## 2018-12-16 ENCOUNTER — Encounter: Payer: Self-pay | Admitting: Women's Health

## 2018-12-20 DIAGNOSIS — F4322 Adjustment disorder with anxiety: Secondary | ICD-10-CM | POA: Diagnosis not present

## 2018-12-23 DIAGNOSIS — F321 Major depressive disorder, single episode, moderate: Secondary | ICD-10-CM | POA: Diagnosis not present

## 2018-12-23 DIAGNOSIS — E669 Obesity, unspecified: Secondary | ICD-10-CM | POA: Diagnosis not present

## 2018-12-23 DIAGNOSIS — Z6832 Body mass index (BMI) 32.0-32.9, adult: Secondary | ICD-10-CM | POA: Diagnosis not present

## 2018-12-23 DIAGNOSIS — Z0000 Encounter for general adult medical examination without abnormal findings: Secondary | ICD-10-CM | POA: Diagnosis not present

## 2018-12-27 ENCOUNTER — Encounter: Payer: Self-pay | Admitting: Women's Health

## 2018-12-27 ENCOUNTER — Telehealth (INDEPENDENT_AMBULATORY_CARE_PROVIDER_SITE_OTHER): Payer: BC Managed Care – PPO | Admitting: Women's Health

## 2018-12-27 DIAGNOSIS — F320 Major depressive disorder, single episode, mild: Secondary | ICD-10-CM

## 2018-12-27 DIAGNOSIS — Z3009 Encounter for other general counseling and advice on contraception: Secondary | ICD-10-CM

## 2018-12-27 DIAGNOSIS — F329 Major depressive disorder, single episode, unspecified: Secondary | ICD-10-CM | POA: Insufficient documentation

## 2018-12-27 DIAGNOSIS — F32A Depression, unspecified: Secondary | ICD-10-CM | POA: Insufficient documentation

## 2018-12-27 NOTE — Patient Instructions (Signed)

## 2018-12-27 NOTE — Progress Notes (Addendum)
Failed video visit due to inability to connect, converted to tele-visit, confirmed patient identity with birthdate.  Patient is at home, I am here at the office.  Agreeable for tele-visit.  33 year old engaged BF G41, P1 son is almost 46 years old doing well, thriving.  Reports has  struggled with depression for many years.  Had been on Wellbutrin after delivery with good relief of depression has now been off about 1 year.    Questions if Nexplanon that was placed 06/2018 causing depression.  States is tearful most days of the week, having difficulty staying calm, easily agitated, feeling sad, sleeping well.  Works for the [place], job going well.  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  Does have family in the area that is supportive.  History of migraines with combination OCs in the past.  Denies any suicidal thoughts.  Is in counseling on a regular basis and was prescribed Wellbutrin and questions if she should start that now or have Nexplanon removed and then start.  Denies any other health concerns today, no other known medical problems.  Amenorrheic with Nexplanon having depression  Plan: Reviewed importance of self-care, date night with fianc while home, leisure activities, meditation, adding yoga to routine.  Encouraged to continue counseling, reviewed since has had long-term issues with depression the Nexplanon could possibly be contributing but may not be the only source.  Best to start on the Wellbutrin as recommended by her counselor/psychiatrist.  Reviewed best to take daily, may take several weeks to see improvement.  Reviewed if continued problems other options available such as an IUD.  Agreeable with plan to continue with Nexplanon and add Wellbutrin.  Length of tele-visit 15 minutes

## 2019-01-03 DIAGNOSIS — F4322 Adjustment disorder with anxiety: Secondary | ICD-10-CM | POA: Diagnosis not present

## 2019-01-13 DIAGNOSIS — Z0000 Encounter for general adult medical examination without abnormal findings: Secondary | ICD-10-CM | POA: Diagnosis not present

## 2019-01-13 DIAGNOSIS — Z13220 Encounter for screening for lipoid disorders: Secondary | ICD-10-CM | POA: Diagnosis not present

## 2019-01-18 DIAGNOSIS — F321 Major depressive disorder, single episode, moderate: Secondary | ICD-10-CM | POA: Diagnosis not present

## 2019-02-06 DIAGNOSIS — F4322 Adjustment disorder with anxiety: Secondary | ICD-10-CM | POA: Diagnosis not present

## 2019-02-11 DIAGNOSIS — R05 Cough: Secondary | ICD-10-CM | POA: Diagnosis not present

## 2019-02-11 DIAGNOSIS — R0981 Nasal congestion: Secondary | ICD-10-CM | POA: Diagnosis not present

## 2019-02-20 DIAGNOSIS — F4322 Adjustment disorder with anxiety: Secondary | ICD-10-CM | POA: Diagnosis not present

## 2019-03-06 DIAGNOSIS — F4322 Adjustment disorder with anxiety: Secondary | ICD-10-CM | POA: Diagnosis not present

## 2019-04-03 ENCOUNTER — Telehealth: Payer: Self-pay | Admitting: *Deleted

## 2019-04-03 ENCOUNTER — Other Ambulatory Visit: Payer: Self-pay

## 2019-04-03 DIAGNOSIS — F4322 Adjustment disorder with anxiety: Secondary | ICD-10-CM | POA: Diagnosis not present

## 2019-04-03 NOTE — Telephone Encounter (Signed)
Patient informed, transferred to appointment desk  

## 2019-04-03 NOTE — Telephone Encounter (Signed)
Patient called had Nexplanon inserted on 06/21/18 reports on Friday 03/31/19 noticed chest tightness, dizziness, left arm numbness and tinging in shoulder radiating down to thigh. Today no chest tightness, still hurts to lay on arm, left side numbness and tinging still only. Patient has never had symptoms before, she assumes its related to Nexplanon because its only in left arm, if so will have removed. Patient said she wasn't sure what to do? Please advise

## 2019-04-03 NOTE — Telephone Encounter (Signed)
Office visit may be best.  since it is so long after Nexplanon placed most likely not the cause.  Has she checked her blood pressure at home if able?

## 2019-04-05 ENCOUNTER — Encounter: Payer: Self-pay | Admitting: Women's Health

## 2019-04-05 ENCOUNTER — Other Ambulatory Visit: Payer: Self-pay

## 2019-04-05 ENCOUNTER — Ambulatory Visit: Payer: BC Managed Care – PPO | Admitting: Women's Health

## 2019-04-05 VITALS — BP 124/80

## 2019-04-05 DIAGNOSIS — M79602 Pain in left arm: Secondary | ICD-10-CM

## 2019-04-05 NOTE — Progress Notes (Signed)
33 year old engaged BF G1, P1 presents with left arm pain for the past several days, tingling, achy sensation and questions if Nexplanon is causing the discomfort.  Denies change in routine, exercise, injury.  Amenorrheic with Nexplanon.  Denies shortness of breath, chest discomfort, difficulty breathing, or vertigo.  States she is under increased stress due to job and fianc out of town [employer] school.  Reports son is almost 2 and doing well, has been on Wellbutrin now for the past few months and feeling much better and able to deal with work/home stressors much better.    Exam: Appears well.  Blood pressure 124/80, heart rate 80 regular rhythm, lungs clear throughout.  Full range of motion of left arm, some discomfort with full extension, no visible erythema, pulses strong radial.  Full coordination of left hand, some discomfort in the shoulder region with full mobility.  Nexplanon palpated, nontender.  Left arm pain  Plan: Encouraged over-the-counter Motrin, reviewed possible shoulder bursitis/arthritis may be causing discomfort.  Rest, avoid heavy lifting and follow-up with primary care if continued left arm pain.  Reassured it does not seem to be related to Nexplanon.

## 2019-04-06 LAB — CBC WITH DIFFERENTIAL/PLATELET
Absolute Monocytes: 518 cells/uL (ref 200–950)
Basophils Absolute: 28 cells/uL (ref 0–200)
Basophils Relative: 0.4 %
Eosinophils Absolute: 21 cells/uL (ref 15–500)
Eosinophils Relative: 0.3 %
HCT: 40.7 % (ref 35.0–45.0)
Hemoglobin: 13.1 g/dL (ref 11.7–15.5)
Lymphs Abs: 2783 cells/uL (ref 850–3900)
MCH: 28.4 pg (ref 27.0–33.0)
MCHC: 32.2 g/dL (ref 32.0–36.0)
MCV: 88.3 fL (ref 80.0–100.0)
MPV: 10.4 fL (ref 7.5–12.5)
Monocytes Relative: 7.3 %
Neutro Abs: 3749 cells/uL (ref 1500–7800)
Neutrophils Relative %: 52.8 %
Platelets: 372 10*3/uL (ref 140–400)
RBC: 4.61 10*6/uL (ref 3.80–5.10)
RDW: 12.8 % (ref 11.0–15.0)
Total Lymphocyte: 39.2 %
WBC: 7.1 10*3/uL (ref 3.8–10.8)

## 2019-04-06 LAB — COMPREHENSIVE METABOLIC PANEL
AG Ratio: 1.8 (calc) (ref 1.0–2.5)
ALT: 13 U/L (ref 6–29)
AST: 15 U/L (ref 10–30)
Albumin: 4.6 g/dL (ref 3.6–5.1)
Alkaline phosphatase (APISO): 45 U/L (ref 31–125)
BUN/Creatinine Ratio: 7 (calc) (ref 6–22)
BUN: 6 mg/dL — ABNORMAL LOW (ref 7–25)
CO2: 23 mmol/L (ref 20–32)
Calcium: 9.5 mg/dL (ref 8.6–10.2)
Chloride: 104 mmol/L (ref 98–110)
Creat: 0.86 mg/dL (ref 0.50–1.10)
Globulin: 2.5 g/dL (calc) (ref 1.9–3.7)
Glucose, Bld: 93 mg/dL (ref 65–99)
Potassium: 4.3 mmol/L (ref 3.5–5.3)
Sodium: 138 mmol/L (ref 135–146)
Total Bilirubin: 0.8 mg/dL (ref 0.2–1.2)
Total Protein: 7.1 g/dL (ref 6.1–8.1)

## 2019-04-06 LAB — TSH: TSH: 1.32 mIU/L

## 2019-04-17 DIAGNOSIS — F4322 Adjustment disorder with anxiety: Secondary | ICD-10-CM | POA: Diagnosis not present

## 2019-05-02 DIAGNOSIS — F4322 Adjustment disorder with anxiety: Secondary | ICD-10-CM | POA: Diagnosis not present

## 2019-05-29 DIAGNOSIS — F4322 Adjustment disorder with anxiety: Secondary | ICD-10-CM | POA: Diagnosis not present

## 2019-05-31 DIAGNOSIS — R0981 Nasal congestion: Secondary | ICD-10-CM | POA: Diagnosis not present

## 2019-05-31 DIAGNOSIS — M791 Myalgia, unspecified site: Secondary | ICD-10-CM | POA: Diagnosis not present

## 2019-05-31 DIAGNOSIS — Z20822 Contact with and (suspected) exposure to covid-19: Secondary | ICD-10-CM | POA: Diagnosis not present

## 2019-05-31 DIAGNOSIS — R5383 Other fatigue: Secondary | ICD-10-CM | POA: Diagnosis not present

## 2019-06-05 DIAGNOSIS — Z20822 Contact with and (suspected) exposure to covid-19: Secondary | ICD-10-CM | POA: Diagnosis not present

## 2019-06-05 DIAGNOSIS — R438 Other disturbances of smell and taste: Secondary | ICD-10-CM | POA: Diagnosis not present

## 2019-06-05 DIAGNOSIS — R0981 Nasal congestion: Secondary | ICD-10-CM | POA: Diagnosis not present

## 2019-06-15 DIAGNOSIS — H5213 Myopia, bilateral: Secondary | ICD-10-CM | POA: Diagnosis not present

## 2019-06-19 DIAGNOSIS — K581 Irritable bowel syndrome with constipation: Secondary | ICD-10-CM | POA: Diagnosis not present

## 2019-07-10 DIAGNOSIS — F4322 Adjustment disorder with anxiety: Secondary | ICD-10-CM | POA: Diagnosis not present

## 2019-07-24 DIAGNOSIS — F4322 Adjustment disorder with anxiety: Secondary | ICD-10-CM | POA: Diagnosis not present

## 2019-08-09 DIAGNOSIS — F4322 Adjustment disorder with anxiety: Secondary | ICD-10-CM | POA: Diagnosis not present

## 2019-08-21 ENCOUNTER — Encounter: Payer: Self-pay | Admitting: Nurse Practitioner

## 2019-08-21 ENCOUNTER — Other Ambulatory Visit: Payer: Self-pay

## 2019-08-21 ENCOUNTER — Ambulatory Visit: Payer: BC Managed Care – PPO | Admitting: Nurse Practitioner

## 2019-08-21 VITALS — BP 122/80

## 2019-08-21 DIAGNOSIS — B373 Candidiasis of vulva and vagina: Secondary | ICD-10-CM

## 2019-08-21 DIAGNOSIS — N898 Other specified noninflammatory disorders of vagina: Secondary | ICD-10-CM

## 2019-08-21 DIAGNOSIS — F4322 Adjustment disorder with anxiety: Secondary | ICD-10-CM | POA: Diagnosis not present

## 2019-08-21 DIAGNOSIS — B3731 Acute candidiasis of vulva and vagina: Secondary | ICD-10-CM

## 2019-08-21 LAB — WET PREP FOR TRICH, YEAST, CLUE

## 2019-08-21 MED ORDER — FLUCONAZOLE 100 MG PO TABS: ORAL_TABLET | ORAL | 0 refills | Status: DC

## 2019-08-21 NOTE — Patient Instructions (Signed)
Vaginal Yeast Infection, Adult  Vaginal yeast infection is a condition that causes vaginal discharge as well as soreness, swelling, and redness (inflammation) of the vagina. This is a common condition. Some women get this infection frequently. What are the causes? This condition is caused by a change in the normal balance of the yeast (candida) and bacteria that live in the vagina. This change causes an overgrowth of yeast, which causes the inflammation. What increases the risk? The condition is more likely to develop in women who:  Take antibiotic medicines.  Have diabetes.  Take birth control pills.  Are pregnant.  Douche often.  Have a weak body defense system (immune system).  Have been taking steroid medicines for a long time.  Frequently wear tight clothing. What are the signs or symptoms? Symptoms of this condition include:  White, thick, creamy vaginal discharge.  Swelling, itching, redness, and irritation of the vagina. The lips of the vagina (vulva) may be affected as well.  Pain or a burning feeling while urinating.  Pain during sex. How is this diagnosed? This condition is diagnosed based on:  Your medical history.  A physical exam.  A pelvic exam. Your health care provider will examine a sample of your vaginal discharge under a microscope. Your health care provider may send this sample for testing to confirm the diagnosis. How is this treated? This condition is treated with medicine. Medicines may be over-the-counter or prescription. You may be told to use one or more of the following:  Medicine that is taken by mouth (orally).  Medicine that is applied as a cream (topically).  Medicine that is inserted directly into the vagina (suppository). Follow these instructions at home:  Lifestyle  Do not have sex until your health care provider approves. Tell your sex partner that you have a yeast infection. That person should go to his or her health care  provider and ask if they should also be treated.  Do not wear tight clothes, such as pantyhose or tight pants.  Wear breathable cotton underwear. General instructions  Take or apply over-the-counter and prescription medicines only as told by your health care provider.  Eat more yogurt. This may help to keep your yeast infection from returning.  Do not use tampons until your health care provider approves.  Try taking a sitz bath to help with discomfort. This is a warm water bath that is taken while you are sitting down. The water should only come up to your hips and should cover your buttocks. Do this 3-4 times per day or as told by your health care provider.  Do not douche.  If you have diabetes, keep your blood sugar levels under control.  Keep all follow-up visits as told by your health care provider. This is important. Contact a health care provider if:  You have a fever.  Your symptoms go away and then return.  Your symptoms do not get better with treatment.  Your symptoms get worse.  You have new symptoms.  You develop blisters in or around your vagina.  You have blood coming from your vagina and it is not your menstrual period.  You develop pain in your abdomen. Summary  Vaginal yeast infection is a condition that causes discharge as well as soreness, swelling, and redness (inflammation) of the vagina.  This condition is treated with medicine. Medicines may be over-the-counter or prescription.  Take or apply over-the-counter and prescription medicines only as told by your health care provider.  Do not douche.   Do not have sex or use tampons until your health care provider approves.  Contact a health care provider if your symptoms do not get better with treatment or your symptoms go away and then return. This information is not intended to replace advice given to you by your health care provider. Make sure you discuss any questions you have with your health care  provider. Document Revised: 12/02/2018 Document Reviewed: 09/20/2017 Elsevier Patient Education  2020 [employer]  

## 2019-08-21 NOTE — Progress Notes (Signed)
SUBJECTIVE: Engaged BF G1P1 who complains of vaginal itching and discharge x 1.5 weeks. Denies dysuria, hematuria, and abdominal pain. History of recurrent yeast infections but has not had one since having her son 2 years ago. Reports good vaginal hygiene routine. 06/2018 Nexplanon.  OBJECTIVE: Appears well, in no apparent distress.  External genitalia is red with a small amount of white discharge, mild odor Vaginal wall is red with white discharge Wet prep: yeast present, moderate bacteria, few WBCs  Vulvovaginitis candidiasis   PLAN: Diflucan 100 mg two tablets today, repeat in 72 hours. 1 tablet weekly as needed. Educated on vaginal hygiene. Follow up as needed if symptoms worsen or do not improve.

## 2019-08-29 DIAGNOSIS — L920 Granuloma annulare: Secondary | ICD-10-CM | POA: Diagnosis not present

## 2019-09-04 ENCOUNTER — Ambulatory Visit: Payer: BC Managed Care – PPO | Admitting: Women's Health

## 2019-09-04 ENCOUNTER — Encounter: Payer: Self-pay | Admitting: Women's Health

## 2019-09-04 ENCOUNTER — Other Ambulatory Visit: Payer: Self-pay

## 2019-09-04 DIAGNOSIS — B373 Candidiasis of vulva and vagina: Secondary | ICD-10-CM

## 2019-09-04 DIAGNOSIS — R300 Dysuria: Secondary | ICD-10-CM | POA: Diagnosis not present

## 2019-09-04 DIAGNOSIS — F4322 Adjustment disorder with anxiety: Secondary | ICD-10-CM | POA: Diagnosis not present

## 2019-09-04 DIAGNOSIS — B3731 Acute candidiasis of vulva and vagina: Secondary | ICD-10-CM

## 2019-09-04 DIAGNOSIS — N898 Other specified noninflammatory disorders of vagina: Secondary | ICD-10-CM

## 2019-09-04 LAB — WET PREP FOR TRICH, YEAST, CLUE

## 2019-09-04 MED ORDER — FLUCONAZOLE 100 MG PO TABS: ORAL_TABLET | ORAL | 0 refills | Status: DC

## 2019-09-04 NOTE — Progress Notes (Signed)
34 year old engaged B F G1, P1 presents with complaint of vaginal irritation/itching causing difficulty with sitting, sleeping, reports vaginal area feeling miserable for the last few days.  Was treated for yeast vaginitis with Diflucan 200 mg every 72 hours x2 doses 08/21/2019 with some relief but cycle started used a new brand of Tampax and symptoms escalated.  Denies urinary symptoms, abdominal/back pain or fever.  Rare cycle Nexplanon placed 06/2018.  Has had problems with recurrent yeast in the past.  Fianc in dental school in [place]/good relationship.  Exam: Appears uncomfortable, no CVAT.  Abdomen soft, nontender, external genitalia extremely erythematous, speculum exam moderate amount of a curdy discharge noted vaginal walls also erythematous no lesions noted, wet prep positive for yeast, negative for BV. UA +1 leukocytes, 6-10 WBCs, no RBCs, few bacteria  Yeast vaginitis  Plan: We will repeat Diflucan 200 mg 1 today, repeat in 72 hours, 2 doses. Reviewed possibility of allergic reaction to new brand of Tampax.  Annual exam next week we will check a glucose and test of cure wet prep.  Urine culture pending.  GC/Chlamydia culture pending per request.  Father diabetes.

## 2019-09-04 NOTE — Patient Instructions (Signed)
Vaginal Yeast Infection, Adult  Vaginal yeast infection is a condition that causes vaginal discharge as well as soreness, swelling, and redness (inflammation) of the vagina. This is a common condition. Some women get this infection frequently. What are the causes? This condition is caused by a change in the normal balance of the yeast (candida) and bacteria that live in the vagina. This change causes an overgrowth of yeast, which causes the inflammation. What increases the risk? The condition is more likely to develop in women who:  Take antibiotic medicines.  Have diabetes.  Take birth control pills.  Are pregnant.  Douche often.  Have a weak body defense system (immune system).  Have been taking steroid medicines for a long time.  Frequently wear tight clothing. What are the signs or symptoms? Symptoms of this condition include:  White, thick, creamy vaginal discharge.  Swelling, itching, redness, and irritation of the vagina. The lips of the vagina (vulva) may be affected as well.  Pain or a burning feeling while urinating.  Pain during sex. How is this diagnosed? This condition is diagnosed based on:  Your medical history.  A physical exam.  A pelvic exam. Your health care provider will examine a sample of your vaginal discharge under a microscope. Your health care provider may send this sample for testing to confirm the diagnosis. How is this treated? This condition is treated with medicine. Medicines may be over-the-counter or prescription. You may be told to use one or more of the following:  Medicine that is taken by mouth (orally).  Medicine that is applied as a cream (topically).  Medicine that is inserted directly into the vagina (suppository). Follow these instructions at home:  Lifestyle  Do not have sex until your health care provider approves. Tell your sex partner that you have a yeast infection. That person should go to his or her health care  provider and ask if they should also be treated.  Do not wear tight clothes, such as pantyhose or tight pants.  Wear breathable cotton underwear. General instructions  Take or apply over-the-counter and prescription medicines only as told by your health care provider.  Eat more yogurt. This may help to keep your yeast infection from returning.  Do not use tampons until your health care provider approves.  Try taking a sitz bath to help with discomfort. This is a warm water bath that is taken while you are sitting down. The water should only come up to your hips and should cover your buttocks. Do this 3-4 times per day or as told by your health care provider.  Do not douche.  If you have diabetes, keep your blood sugar levels under control.  Keep all follow-up visits as told by your health care provider. This is important. Contact a health care provider if:  You have a fever.  Your symptoms go away and then return.  Your symptoms do not get better with treatment.  Your symptoms get worse.  You have new symptoms.  You develop blisters in or around your vagina.  You have blood coming from your vagina and it is not your menstrual period.  You develop pain in your abdomen. Summary  Vaginal yeast infection is a condition that causes discharge as well as soreness, swelling, and redness (inflammation) of the vagina.  This condition is treated with medicine. Medicines may be over-the-counter or prescription.  Take or apply over-the-counter and prescription medicines only as told by your health care provider.  Do not douche.   Do not have sex or use tampons until your health care provider approves.  Contact a health care provider if your symptoms do not get better with treatment or your symptoms go away and then return. This information is not intended to replace advice given to you by your health care provider. Make sure you discuss any questions you have with your health care  provider. Document Revised: 12/02/2018 Document Reviewed: 09/20/2017 Elsevier Patient Education  2020 [employer]  

## 2019-09-04 NOTE — Telephone Encounter (Signed)
Patient called and schedule OV today at 3pm. Message will be closed.

## 2019-09-05 ENCOUNTER — Other Ambulatory Visit: Payer: Self-pay

## 2019-09-05 LAB — C. TRACHOMATIS/N. GONORRHOEAE RNA
C. trachomatis RNA, TMA: NOT DETECTED
N. gonorrhoeae RNA, TMA: NOT DETECTED

## 2019-09-06 ENCOUNTER — Ambulatory Visit (INDEPENDENT_AMBULATORY_CARE_PROVIDER_SITE_OTHER): Payer: BC Managed Care – PPO | Admitting: Women's Health

## 2019-09-06 ENCOUNTER — Encounter: Payer: Self-pay | Admitting: Women's Health

## 2019-09-06 VITALS — BP 118/74 | Ht 65.0 in | Wt 191.0 lb

## 2019-09-06 DIAGNOSIS — Z01419 Encounter for gynecological examination (general) (routine) without abnormal findings: Secondary | ICD-10-CM

## 2019-09-06 DIAGNOSIS — Z8632 Personal history of gestational diabetes: Secondary | ICD-10-CM

## 2019-09-06 DIAGNOSIS — N898 Other specified noninflammatory disorders of vagina: Secondary | ICD-10-CM

## 2019-09-06 LAB — CULTURE INDICATED

## 2019-09-06 LAB — URINALYSIS, COMPLETE W/RFL CULTURE
Bilirubin Urine: NEGATIVE
Glucose, UA: NEGATIVE
Hgb urine dipstick: NEGATIVE
Hyaline Cast: NONE SEEN /LPF
Ketones, ur: NEGATIVE
Nitrites, Initial: NEGATIVE
Protein, ur: NEGATIVE
RBC / HPF: NONE SEEN /HPF (ref 0–2)
Specific Gravity, Urine: 1.02 (ref 1.001–1.03)
pH: 6 (ref 5.0–8.0)

## 2019-09-06 LAB — URINE CULTURE
MICRO NUMBER:: 10378848
Result:: NO GROWTH
SPECIMEN QUALITY:: ADEQUATE

## 2019-09-06 LAB — WET PREP FOR TRICH, YEAST, CLUE

## 2019-09-06 MED ORDER — HYDROCORTISONE ACETATE 25 MG RE SUPP: 25.0000 mg | Freq: Two times a day (BID) | RECTAL | 0 refills | Status: DC

## 2019-09-06 NOTE — Progress Notes (Signed)
   Brandy Newton 1986-03-23 [id number]   History:  34 y.o. G1P1 for annual.  06/2018 Nexplanon, amenorrheic for 6 months now getting mostly monthly cycles.  Normal Pap history.  08/2017 normal Pap at [facility] office.  History of migraines with combination OCs.  GDM.  Treated for yeast 09/04/2019 with Diflucan some relief, questionable allergic reaction from new brand of organic tampons.  Continues to have burning, discomfort, poor sleep from the discomfort for the past 3 nights.  Gynecologic History Patient's last menstrual period was 08/30/2019. Period Cycle (Days): 28 Period Duration (Days): 6 Period Pattern: Regular Menstrual Flow: Light, Moderate Dysmenorrhea: (!) Mild Dysmenorrhea Symptoms: Cramping  Past medical history, past surgical history, family history and social history were all reviewed and documented in the EPIC chart.  Son 2-1/2.  Works for the city of [place].  Graduated from [employer].  Fianc in dental school.  ROS:  A ROS was performed and pertinent positives and negatives are included.  Exam:  Vitals:   09/06/19 0955  BP: 118/74  Weight: 191 lb (86.6 kg)  Height: 5\' 5"  (1.651 m)   Body mass index is 31.78 kg/m.  General appearance:  Normal Thyroid:  Symmetrical, normal in size, without palpable masses or nodularity. Respiratory  Auscultation:  Clear without wheezing or rhonchi Cardiovascular  Auscultation:  Regular rate, without rubs, murmurs or gallops  Edema/varicosities:  Not grossly evident Abdominal  Soft,nontender, without masses, guarding or rebound.  Liver/spleen:  No organomegaly noted  Hernia:  None appreciated  Skin  Inspection:  Grossly normal   Breasts: Examined lying and sitting.   Right: Without masses, retractions, discharge or axillary adenopathy.   Left: Without masses, retractions, discharge or axillary adenopathy. Gentitourinary   Inguinal/mons:  Normal without inguinal adenopathy  External genitalia:   Normal  BUS/Urethra/Skene's glands:  Normal  Vagina: Extremely erythemic, wet prep negative, tender to touch   Cervix:  Normal  Uterus:  normal in size, shape and contour.  Midline and mobile  Adnexa/parametria:     Rt: Without masses or tenderness.   Lt: Without masses or tenderness.  Anus and perineum: Normal  Digital rectal exam: Normal sphincter tone without palpated masses or tenderness  Assessment/Plan:  34 y.o. engaged BF G1, P1 for annual exam.     06/2018 Nexplanon mostly monthly cycles Probable allergic reaction vaginally from new brand of Tampax Granular annualar dermatologist managing History of GDM  Plan: Options for erythemic/painful vagina discussed will try Anusol suppositories will try half of a suppository initially to see if she can get any relief vaginally.  Instructed to call if continued problems.  SBEs, annual screening mammogram at 40, calcium rich foods, MVI daily encouraged.  Aware of Nexplanon is good for 3 years.  Continue healthy lifestyle of decreasing calories and regular exercise, has lost 15 pounds in the past year with lifestyle changes.  CBC, hemoglobin A1c, CMP.  Pap normal 2019, new screening guidelines reviewed.      2020 [facility], 10:57 AM 09/06/2019

## 2019-09-06 NOTE — Patient Instructions (Signed)
Multivitamin daily Pleasure knowing you!  Health Maintenance, Female Adopting a healthy lifestyle and getting preventive care are important in promoting health and wellness. Ask your health care provider about:  The right schedule for you to have regular tests and exams.  Things you can do on your own to prevent diseases and keep yourself healthy. What should I know about diet, weight, and exercise? Eat a healthy diet   Eat a diet that includes plenty of vegetables, fruits, low-fat dairy products, and lean protein.  Do not eat a lot of foods that are high in solid fats, added sugars, or sodium. Maintain a healthy weight Body mass index (BMI) is used to identify weight problems. It estimates body fat based on height and weight. Your health care provider can help determine your BMI and help you achieve or maintain a healthy weight. Get regular exercise Get regular exercise. This is one of the most important things you can do for your health. Most adults should:  Exercise for at least 150 minutes each week. The exercise should increase your heart rate and make you sweat (moderate-intensity exercise).  Do strengthening exercises at least twice a week. This is in addition to the moderate-intensity exercise.  Spend less time sitting. Even light physical activity can be beneficial. Watch cholesterol and blood lipids Have your blood tested for lipids and cholesterol at 33 years of age, then have this test every 5 years. Have your cholesterol levels checked more often if:  Your lipid or cholesterol levels are high.  You are older than 33 years of age.  You are at high risk for heart disease. What should I know about cancer screening? Depending on your health history and family history, you may need to have cancer screening at various ages. This may include screening for:  Breast cancer.  Cervical cancer.  Colorectal cancer.  Skin cancer.  Lung cancer. What should I know about  heart disease, diabetes, and high blood pressure? Blood pressure and heart disease  High blood pressure causes heart disease and increases the risk of stroke. This is more likely to develop in people who have high blood pressure readings, are of African descent, or are overweight.  Have your blood pressure checked: ? Every 3-5 years if you are 70-33 years of age. ? Every year if you are 33 years old or older. Diabetes Have regular diabetes screenings. This checks your fasting blood sugar level. Have the screening done:  Once every three years after age 33 if you are at a normal weight and have a low risk for diabetes.  More often and at a younger age if you are overweight or have a high risk for diabetes. What should I know about preventing infection? Hepatitis B If you have a higher risk for hepatitis B, you should be screened for this virus. Talk with your health care provider to find out if you are at risk for hepatitis B infection. Hepatitis C Testing is recommended for:  Everyone born from [birth year] through 1964.  Anyone with known risk factors for hepatitis C. Sexually transmitted infections (STIs)  Get screened for STIs, including gonorrhea and chlamydia, if: ? You are sexually active and are younger than 33 years of age. ? You are older than 33 years of age and your health care provider tells you that you are at risk for this type of infection. ? Your sexual activity has changed since you were last screened, and you are at increased risk for chlamydia or gonorrhea.  Ask your health care provider if you are at risk.  Ask your health care provider about whether you are at high risk for HIV. Your health care provider may recommend a prescription medicine to help prevent HIV infection. If you choose to take medicine to prevent HIV, you should first get tested for HIV. You should then be tested every 3 months for as long as you are taking the medicine. Pregnancy  If you are about to  stop having your period (premenopausal) and you may become pregnant, seek counseling before you get pregnant.  Take 400 to 800 micrograms (mcg) of folic acid every day if you become pregnant.  Ask for birth control (contraception) if you want to prevent pregnancy. Osteoporosis and menopause Osteoporosis is a disease in which the bones lose minerals and strength with aging. This can result in bone fractures. If you are 7 years old or older, or if you are at risk for osteoporosis and fractures, ask your health care provider if you should:  Be screened for bone loss.  Take a calcium or vitamin D supplement to lower your risk of fractures.  Be given hormone replacement therapy (HRT) to treat symptoms of menopause. Follow these instructions at home: Lifestyle  Do not use any products that contain nicotine or tobacco, such as cigarettes, e-cigarettes, and chewing tobacco. If you need help quitting, ask your health care provider.  Do not use street drugs.  Do not share needles.  Ask your health care provider for help if you need support or information about quitting drugs. Alcohol use  Do not drink alcohol if: ? Your health care provider tells you not to drink. ? You are pregnant, may be pregnant, or are planning to become pregnant.  If you drink alcohol: ? Limit how much you use to 0-1 drink a day. ? Limit intake if you are breastfeeding.  Be aware of how much alcohol is in your drink. In the [place] one drink equals one 12 oz bottle of beer (355 mL), one 5 oz glass of wine (148 mL), or one 1 oz glass of hard liquor (44 mL). General instructions  Schedule regular health, dental, and eye exams.  Stay current with your vaccines.  Tell your health care provider if: ? You often feel depressed. ? You have ever been abused or do not feel safe at home. Summary  Adopting a healthy lifestyle and getting preventive care are important in promoting health and wellness.  Follow your  health care provider's instructions about healthy diet, exercising, and getting tested or screened for diseases.  Follow your health care provider's instructions on monitoring your cholesterol and blood pressure. This information is not intended to replace advice given to you by your health care provider. Make sure you discuss any questions you have with your health care provider. Document Revised: 04/27/2018 Document Reviewed: 04/27/2018 Elsevier Patient Education  2020 [employer].

## 2019-09-07 LAB — CBC WITH DIFFERENTIAL/PLATELET
Absolute Monocytes: 403 cells/uL (ref 200–950)
Basophils Absolute: 50 cells/uL (ref 0–200)
Basophils Relative: 0.8 %
Eosinophils Absolute: 202 cells/uL (ref 15–500)
Eosinophils Relative: 3.2 %
HCT: 41.8 % (ref 35.0–45.0)
Hemoglobin: 13.6 g/dL (ref 11.7–15.5)
Lymphs Abs: 2778 cells/uL (ref 850–3900)
MCH: 28.9 pg (ref 27.0–33.0)
MCHC: 32.5 g/dL (ref 32.0–36.0)
MCV: 88.9 fL (ref 80.0–100.0)
MPV: 10.1 fL (ref 7.5–12.5)
Monocytes Relative: 6.4 %
Neutro Abs: 2867 cells/uL (ref 1500–7800)
Neutrophils Relative %: 45.5 %
Platelets: 415 10*3/uL — ABNORMAL HIGH (ref 140–400)
RBC: 4.7 10*6/uL (ref 3.80–5.10)
RDW: 12.5 % (ref 11.0–15.0)
Total Lymphocyte: 44.1 %
WBC: 6.3 10*3/uL (ref 3.8–10.8)

## 2019-09-07 LAB — COMPREHENSIVE METABOLIC PANEL
AG Ratio: 1.5 (calc) (ref 1.0–2.5)
ALT: 14 U/L (ref 6–29)
AST: 13 U/L (ref 10–30)
Albumin: 4.4 g/dL (ref 3.6–5.1)
Alkaline phosphatase (APISO): 50 U/L (ref 31–125)
BUN: 11 mg/dL (ref 7–25)
CO2: 24 mmol/L (ref 20–32)
Calcium: 9.9 mg/dL (ref 8.6–10.2)
Chloride: 105 mmol/L (ref 98–110)
Creat: 0.76 mg/dL (ref 0.50–1.10)
Globulin: 2.9 g/dL (calc) (ref 1.9–3.7)
Glucose, Bld: 94 mg/dL (ref 65–99)
Potassium: 4.4 mmol/L (ref 3.5–5.3)
Sodium: 138 mmol/L (ref 135–146)
Total Bilirubin: 0.6 mg/dL (ref 0.2–1.2)
Total Protein: 7.3 g/dL (ref 6.1–8.1)

## 2019-09-07 LAB — HEMOGLOBIN A1C
Hgb A1c MFr Bld: 5.1 % of total Hgb (ref <5.7)
Mean Plasma Glucose: 100 (calc)
eAG (mmol/L): 5.5 (calc)

## 2019-09-13 DIAGNOSIS — L920 Granuloma annulare: Secondary | ICD-10-CM | POA: Diagnosis not present

## 2019-09-28 DIAGNOSIS — F4322 Adjustment disorder with anxiety: Secondary | ICD-10-CM | POA: Diagnosis not present

## 2019-10-09 DIAGNOSIS — F4322 Adjustment disorder with anxiety: Secondary | ICD-10-CM | POA: Diagnosis not present

## 2019-10-11 DIAGNOSIS — F321 Major depressive disorder, single episode, moderate: Secondary | ICD-10-CM | POA: Diagnosis not present

## 2019-10-11 DIAGNOSIS — B373 Candidiasis of vulva and vagina: Secondary | ICD-10-CM | POA: Diagnosis not present

## 2019-10-16 DIAGNOSIS — Z20822 Contact with and (suspected) exposure to covid-19: Secondary | ICD-10-CM | POA: Diagnosis not present

## 2019-10-23 DIAGNOSIS — F4322 Adjustment disorder with anxiety: Secondary | ICD-10-CM | POA: Diagnosis not present

## 2019-11-06 DIAGNOSIS — F4322 Adjustment disorder with anxiety: Secondary | ICD-10-CM | POA: Diagnosis not present

## 2019-11-13 DIAGNOSIS — R300 Dysuria: Secondary | ICD-10-CM | POA: Diagnosis not present

## 2019-11-13 DIAGNOSIS — N898 Other specified noninflammatory disorders of vagina: Secondary | ICD-10-CM | POA: Diagnosis not present

## 2019-11-13 DIAGNOSIS — B373 Candidiasis of vulva and vagina: Secondary | ICD-10-CM | POA: Diagnosis not present

## 2019-11-20 DIAGNOSIS — R102 Pelvic and perineal pain: Secondary | ICD-10-CM | POA: Diagnosis not present

## 2019-11-20 DIAGNOSIS — R300 Dysuria: Secondary | ICD-10-CM | POA: Diagnosis not present

## 2019-11-23 ENCOUNTER — Other Ambulatory Visit: Payer: Self-pay

## 2019-11-23 ENCOUNTER — Ambulatory Visit: Payer: BC Managed Care – PPO | Admitting: Nurse Practitioner

## 2019-11-23 ENCOUNTER — Encounter: Payer: Self-pay | Admitting: Nurse Practitioner

## 2019-11-23 VITALS — BP 120/78

## 2019-11-23 DIAGNOSIS — R300 Dysuria: Secondary | ICD-10-CM

## 2019-11-23 DIAGNOSIS — N3000 Acute cystitis without hematuria: Secondary | ICD-10-CM | POA: Diagnosis not present

## 2019-11-23 DIAGNOSIS — B3731 Acute candidiasis of vulva and vagina: Secondary | ICD-10-CM

## 2019-11-23 DIAGNOSIS — B373 Candidiasis of vulva and vagina: Secondary | ICD-10-CM

## 2019-11-23 DIAGNOSIS — N760 Acute vaginitis: Secondary | ICD-10-CM | POA: Diagnosis not present

## 2019-11-23 DIAGNOSIS — N898 Other specified noninflammatory disorders of vagina: Secondary | ICD-10-CM

## 2019-11-23 LAB — WET PREP FOR TRICH, YEAST, CLUE

## 2019-11-23 MED ORDER — NYSTATIN-TRIAMCINOLONE 100000-0.1 UNIT/GM-% EX OINT: 1.0000 "application " | TOPICAL_OINTMENT | Freq: Two times a day (BID) | CUTANEOUS | 0 refills | Status: AC

## 2019-11-23 MED ORDER — TERCONAZOLE 0.8 % VA CREA: 1.0000 | TOPICAL_CREAM | Freq: Every day | VAGINAL | 1 refills | Status: AC

## 2019-11-23 MED ORDER — SULFAMETHOXAZOLE-TRIMETHOPRIM 800-160 MG PO TABS: 1.0000 | ORAL_TABLET | Freq: Two times a day (BID) | ORAL | 0 refills | Status: AC

## 2019-11-23 NOTE — Progress Notes (Signed)
Acute Office Visit  Subjective:    Patient ID: Brandy Newton, female    DOB: 12/17/1985, 34 y.o.   MRN: [id number]   HPI 34 year old G1 P1 presents today for vaginal itching, discharge and dysuria that began a couple of weeks ago.  She has a history of recurrent yeast infections that typically occur after her cycle and is usually treated effectively with 3 doses of Diflucan. Since March the yeast infections have occurred monthly after her cycle. She was seen 11/13/2019 with the same complaints and was provided with 3 doses of Diflucan but symptoms did not resolve.  UA at this visit was negative.  She was then seen in the emergency room 11/20/2019 where they performed STD testing and transvaginal ultrasound due to CMT and uterine tenderness to rule out PID or TOA.  Ultrasound showed small left ovarian cyst, otherwise unremarkable.  They recommended her follow-up here and take Diflucan weekly.  Patient has been with same partner for many years and is not concerned with STD.   Review of Systems  Constitutional: Negative.   Gastrointestinal: Negative.   Genitourinary: Positive for dysuria, vaginal discharge and vaginal pain. Negative for flank pain, frequency, hematuria, pelvic pain and urgency.       Objective:    Physical Exam Constitutional:      Appearance: Normal appearance. She is obese.  Abdominal:     Tenderness: There is no right CVA tenderness or left CVA tenderness.  Genitourinary:    Labia:        Right: Tenderness present.        Left: Tenderness present.      Vagina: Vaginal discharge, erythema and tenderness present.     Cervix: Cervical motion tenderness and discharge (copious, thick, white) present.     Uterus: Normal.      Comments: Generalized erythema of labia minora    BP 120/78 (BP Location: Right Arm, Patient Position: Sitting, Cuff Size: Normal)   LMP 11/05/2019  Wt Readings from Last 3 Encounters:  09/06/19 191 lb (86.6 kg)  06/08/18 204 lb (92.5 kg)    01/15/17 238 lb (108 kg)   UA: 2+ leukocyte, negative blood, WBC 10-20, few bacteria Wet prep: +yeast with pseudohyphae       Assessment & Plan:   Problem List Items Addressed This Visit    None    Visit Diagnoses    Dysuria    -  Primary   Relevant Orders   Urinalysis,Complete w/RFL Culture (Completed)   Vaginal itching       Relevant Orders   WET PREP FOR TRICH, YEAST, CLUE (Completed)   Acute vaginitis       Relevant Medications   nystatin-triamcinolone ointment (MYCOLOG)   Vulvovaginal candidiasis       Relevant Medications   nystatin-triamcinolone ointment (MYCOLOG)   sulfamethoxazole-trimethoprim (BACTRIM DS) 800-160 MG tablet   terconazole (TERAZOL 3) 0.8 % vaginal cream   Other Relevant Orders   SureSwab Bacterial Vaginosis/itis   Acute cystitis without hematuria       Relevant Medications   sulfamethoxazole-trimethoprim (BACTRIM DS) 800-160 MG tablet     Plan: Bactrim DS 800-160 mg twice a day for 5 days, culture pending.  Wet prep positive for yeast and exam consistent with this.  Due to recurrence of yeast infections and no improvement with 3 doses of Diflucan must consider fluconazole resistant Candida.  Fungal culture obtained today.  In the meantime we will treat with terconazole 0.8% vaginal cream for 7 days.  Mycolog  daily as needed after menstrual cycle for recurrent vaginitis.  Depending on fungal culture she may need to be on a suppressive medication and boric acid suppositories. She is concerned with left ovarian cyst seen on ultrasound, we can consider follow-up ultrasound in 3 months.     Olivia Mackie [facility], 10:35 AM 11/23/2019

## 2019-11-23 NOTE — Patient Instructions (Signed)
Vaginal Yeast Infection, Adult  Vaginal yeast infection is a condition that causes vaginal discharge as well as soreness, swelling, and redness (inflammation) of the vagina. This is a common condition. Some women get this infection frequently. What are the causes? This condition is caused by a change in the normal balance of the yeast (candida) and bacteria that live in the vagina. This change causes an overgrowth of yeast, which causes the inflammation. What increases the risk? The condition is more likely to develop in women who:  Take antibiotic medicines.  Have diabetes.  Take birth control pills.  Are pregnant.  Douche often.  Have a weak body defense system (immune system).  Have been taking steroid medicines for a long time.  Frequently wear tight clothing. What are the signs or symptoms? Symptoms of this condition include:  White, thick, creamy vaginal discharge.  Swelling, itching, redness, and irritation of the vagina. The lips of the vagina (vulva) may be affected as well.  Pain or a burning feeling while urinating.  Pain during sex. How is this diagnosed? This condition is diagnosed based on:  Your medical history.  A physical exam.  A pelvic exam. Your health care provider will examine a sample of your vaginal discharge under a microscope. Your health care provider may send this sample for testing to confirm the diagnosis. How is this treated? This condition is treated with medicine. Medicines may be over-the-counter or prescription. You may be told to use one or more of the following:  Medicine that is taken by mouth (orally).  Medicine that is applied as a cream (topically).  Medicine that is inserted directly into the vagina (suppository). Follow these instructions at home:  Lifestyle  Do not have sex until your health care provider approves. Tell your sex partner that you have a yeast infection. That person should go to his or her health care  provider and ask if they should also be treated.  Do not wear tight clothes, such as pantyhose or tight pants.  Wear breathable cotton underwear. General instructions  Take or apply over-the-counter and prescription medicines only as told by your health care provider.  Eat more yogurt. This may help to keep your yeast infection from returning.  Do not use tampons until your health care provider approves.  Try taking a sitz bath to help with discomfort. This is a warm water bath that is taken while you are sitting down. The water should only come up to your hips and should cover your buttocks. Do this 3-4 times per day or as told by your health care provider.  Do not douche.  If you have diabetes, keep your blood sugar levels under control.  Keep all follow-up visits as told by your health care provider. This is important. Contact a health care provider if:  You have a fever.  Your symptoms go away and then return.  Your symptoms do not get better with treatment.  Your symptoms get worse.  You have new symptoms.  You develop blisters in or around your vagina.  You have blood coming from your vagina and it is not your menstrual period.  You develop pain in your abdomen. Summary  Vaginal yeast infection is a condition that causes discharge as well as soreness, swelling, and redness (inflammation) of the vagina.  This condition is treated with medicine. Medicines may be over-the-counter or prescription.  Take or apply over-the-counter and prescription medicines only as told by your health care provider.  Do not douche.   Do not have sex or use tampons until your health care provider approves.  Contact a health care provider if your symptoms do not get better with treatment or your symptoms go away and then return. This information is not intended to replace advice given to you by your health care provider. Make sure you discuss any questions you have with your health care  provider. Document Revised: 12/02/2018 Document Reviewed: 09/20/2017 Elsevier Patient Education  2020 [employer]  Urinary Tract Infection, Adult A urinary tract infection (UTI) is an infection of any part of the urinary tract. The urinary tract includes:  The kidneys.  The ureters.  The bladder.  The urethra. These organs make, store, and get rid of pee (urine) in the body. What are the causes? This is caused by germs (bacteria) in your genital area. These germs grow and cause swelling (inflammation) of your urinary tract. What increases the risk? You are more likely to develop this condition if:  You have a small, thin tube (catheter) to drain pee.  You cannot control when you pee or poop (incontinence).  You are female, and: ? You use these methods to prevent pregnancy:  A medicine that kills sperm (spermicide).  A device that blocks sperm (diaphragm). ? You have low levels of a female hormone (estrogen). ? You are pregnant.  You have genes that add to your risk.  You are sexually active.  You take antibiotic medicines.  You have trouble peeing because of: ? A prostate that is bigger than normal, if you are female. ? A blockage in the part of your body that drains pee from the bladder (urethra). ? A kidney stone. ? A nerve condition that affects your bladder (neurogenic bladder). ? Not getting enough to drink. ? Not peeing often enough.  You have other conditions, such as: ? Diabetes. ? A weak disease-fighting system (immune system). ? Sickle cell disease. ? Gout. ? Injury of the spine. What are the signs or symptoms? Symptoms of this condition include:  Needing to pee right away (urgently).  Peeing often.  Peeing small amounts often.  Pain or burning when peeing.  Blood in the pee.  Pee that smells bad or not like normal.  Trouble peeing.  Pee that is cloudy.  Fluid coming from the vagina, if you are female.  Pain in the belly or lower  back. Other symptoms include:  Throwing up (vomiting).  No urge to eat.  Feeling mixed up (confused).  Being tired and grouchy (irritable).  A fever.  Watery poop (diarrhea). How is this treated? This condition may be treated with:  Antibiotic medicine.  Other medicines.  Drinking enough water. Follow these instructions at home:  Medicines  Take over-the-counter and prescription medicines only as told by your doctor.  If you were prescribed an antibiotic medicine, take it as told by your doctor. Do not stop taking it even if you start to feel better. General instructions  Make sure you: ? Pee until your bladder is empty. ? Do not hold pee for a long time. ? Empty your bladder after sex. ? Wipe from front to back after pooping if you are a female. Use each tissue one time when you wipe.  Drink enough fluid to keep your pee pale yellow.  Keep all follow-up visits as told by your doctor. This is important. Contact a doctor if:  You do not get better after 1-2 days.  Your symptoms go away and then come back. Get help right   away if:  You have very bad back pain.  You have very bad pain in your lower belly.  You have a fever.  You are sick to your stomach (nauseous).  You are throwing up. Summary  A urinary tract infection (UTI) is an infection of any part of the urinary tract.  This condition is caused by germs in your genital area.  There are many risk factors for a UTI. These include having a small, thin tube to drain pee and not being able to control when you pee or poop.  Treatment includes antibiotic medicines for germs.  Drink enough fluid to keep your pee pale yellow. This information is not intended to replace advice given to you by your health care provider. Make sure you discuss any questions you have with your health care provider. Document Revised: 04/21/2018 Document Reviewed: 11/11/2017 Elsevier Patient Education  2020 [employer] Inc.  

## 2019-11-25 LAB — URINALYSIS, COMPLETE W/RFL CULTURE
Bilirubin Urine: NEGATIVE
Glucose, UA: NEGATIVE
Hgb urine dipstick: NEGATIVE
Hyaline Cast: NONE SEEN /LPF
Ketones, ur: NEGATIVE
Nitrites, Initial: NEGATIVE
Protein, ur: NEGATIVE
RBC / HPF: NONE SEEN /HPF (ref 0–2)
Specific Gravity, Urine: 1.025 (ref 1.001–1.03)
pH: 6 (ref 5.0–8.0)

## 2019-11-25 LAB — URINE CULTURE
MICRO NUMBER:: 10680437
Result:: NO GROWTH
SPECIMEN QUALITY:: ADEQUATE

## 2019-11-25 LAB — CULTURE INDICATED

## 2019-11-28 LAB — SURESWAB BACTERIAL VAGINOSIS/ITIS
Atopobium vaginae: 6.6 Log (cells/mL)
C. albicans, DNA: DETECTED — AB
C. glabrata, DNA: NOT DETECTED
C. parapsilosis, DNA: NOT DETECTED
C. tropicalis, DNA: NOT DETECTED
Gardnerella vaginalis: 7.4 Log (cells/mL)
LACTOBACILLUS SPECIES: NOT DETECTED Log (cells/mL)
MEGASPHAERA SPECIES: NOT DETECTED Log (cells/mL)
Trichomonas vaginalis RNA: NOT DETECTED

## 2019-11-29 ENCOUNTER — Telehealth: Payer: Self-pay | Admitting: Nurse Practitioner

## 2019-11-29 ENCOUNTER — Other Ambulatory Visit: Payer: Self-pay | Admitting: Nurse Practitioner

## 2019-11-29 DIAGNOSIS — B9689 Other specified bacterial agents as the cause of diseases classified elsewhere: Secondary | ICD-10-CM

## 2019-11-29 DIAGNOSIS — F4322 Adjustment disorder with anxiety: Secondary | ICD-10-CM | POA: Diagnosis not present

## 2019-11-29 DIAGNOSIS — B3731 Acute candidiasis of vulva and vagina: Secondary | ICD-10-CM

## 2019-11-29 MED ORDER — TINIDAZOLE 500 MG PO TABS: 500.0000 mg | ORAL_TABLET | Freq: Two times a day (BID) | ORAL | 0 refills | Status: AC

## 2019-11-29 MED ORDER — FLUCONAZOLE 150 MG PO TABS: 150.0000 mg | ORAL_TABLET | ORAL | 0 refills | Status: DC

## 2019-11-29 NOTE — Progress Notes (Signed)
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 Tinadmax 500 mg BID x 2 days for BV. Due to recurrence of vaginal yeast and severe symptoms she will take Diflucan 150 mg once a week for 6 months.

## 2019-11-29 NOTE — Telephone Encounter (Signed)
Spoke with patient about results of culture and the treatment recommendations for these. She was agreeable to plan and reports much improvement with terconazole treatment.

## 2019-12-04 DIAGNOSIS — F4322 Adjustment disorder with anxiety: Secondary | ICD-10-CM | POA: Diagnosis not present

## 2019-12-18 ENCOUNTER — Other Ambulatory Visit: Payer: Self-pay

## 2019-12-18 ENCOUNTER — Ambulatory Visit (INDEPENDENT_AMBULATORY_CARE_PROVIDER_SITE_OTHER): Payer: BC Managed Care – PPO | Admitting: Obstetrics and Gynecology

## 2019-12-18 ENCOUNTER — Telehealth: Payer: Self-pay | Admitting: *Deleted

## 2019-12-18 ENCOUNTER — Encounter: Payer: Self-pay | Admitting: Obstetrics and Gynecology

## 2019-12-18 VITALS — BP 110/80

## 2019-12-18 DIAGNOSIS — N760 Acute vaginitis: Secondary | ICD-10-CM | POA: Diagnosis not present

## 2019-12-18 DIAGNOSIS — B3731 Acute candidiasis of vulva and vagina: Secondary | ICD-10-CM

## 2019-12-18 DIAGNOSIS — N898 Other specified noninflammatory disorders of vagina: Secondary | ICD-10-CM

## 2019-12-18 DIAGNOSIS — F4322 Adjustment disorder with anxiety: Secondary | ICD-10-CM | POA: Diagnosis not present

## 2019-12-18 DIAGNOSIS — R350 Frequency of micturition: Secondary | ICD-10-CM

## 2019-12-18 DIAGNOSIS — B373 Candidiasis of vulva and vagina: Secondary | ICD-10-CM

## 2019-12-18 DIAGNOSIS — B9689 Other specified bacterial agents as the cause of diseases classified elsewhere: Secondary | ICD-10-CM | POA: Diagnosis not present

## 2019-12-18 LAB — WET PREP FOR TRICH, YEAST, CLUE

## 2019-12-18 MED ORDER — CLINDAMYCIN PHOSPHATE 2 % VA CREA
1.0000 | TOPICAL_CREAM | Freq: Every day | VAGINAL | 0 refills | Status: AC
Start: 2019-12-18 — End: 2019-12-25

## 2019-12-18 MED ORDER — TERCONAZOLE 0.8 % VA CREA
1.0000 | TOPICAL_CREAM | Freq: Every day | VAGINAL | 0 refills | Status: AC
Start: 2019-12-18 — End: 2019-12-22

## 2019-12-18 NOTE — Patient Instructions (Addendum)
terconazole 0.8% cream again for 4 days to treat the vaginal yeast infection (insert during the morning) 2% vaginal Cleocin cream (for 1 week, insert during the evening) Wear loosefitting cotton underwear at bedtime, or leave the area to open air and place towel on bed  I will have my staff call in a prescription for the vaginal boric acid capsules as we discussed.  I would like you to start using these after you have completed the vaginal cream therapies, and I will have you do that nightly for 2 weeks and then twice weekly after that for 3 months.  Boric acid capsules are fatal swallowed so be careful of how you store them especially with any children or pets. Also consider use of vaginal probiotic capsules suppositories which are available OTC and to be inserted at a time separate from when you are using the vaginal boric acid capsules

## 2019-12-18 NOTE — Progress Notes (Signed)
Brandy Newton 08-20-1985 [id number]  SUBJECTIVE:  34 y.o. G16P1001 female presents for recurrent vaginal infection concern.  She is having increased vaginal discharge, urinary frequency, vaginal itching, painful urination.  She has been having trouble with recurrence of BV and/or vaginal candidiasis.  She has been following with Wyline Beady, NP.  Recently on 11/23/2019 vaginal PCR testing was obtained due to the recurrence of symptoms, this indicated likely bacterial vaginosis in addition to Candida albicans, and she was treated with Tindamax and terconazole 0.8% vaginal cream.  Symptoms resolved.  She is also on suppressive Diflucan once weekly.  She is sexually active, and her partner lives in [place] but they do not typically use condoms.  Is using Nexplanon for birth control and feels that this may be playing a role into the recurrence of vaginal symptoms.  She typically will get the symptoms after her menses ends.   Current Outpatient Medications  Medication Sig Dispense Refill  . buPROPion (WELLBUTRIN XL) 150 MG 24 hr tablet Take 1 tablet by mouth daily.    Marland Kitchen dicyclomine (BENTYL) 20 MG tablet Take 20 mg by mouth every 6 (six) hours.    Marland Kitchen etonogestrel (NEXPLANON) 68 MG IMPL implant 1 each by Subdermal route once.    . nystatin-triamcinolone ointment (MYCOLOG) Apply 1 application topically 2 (two) times daily. 30 g 0  . clindamycin (CLEOCIN) 2 % vaginal cream Place 1 Applicatorful vaginally at bedtime for 7 days. 40 g 0  . terconazole (TERAZOL 3) 0.8 % vaginal cream Place 1 applicator vaginally daily for 4 days. Insert at least 12 hours before use of the clindamycin cream 20 g 0   No current facility-administered medications for this visit.   Allergies: Latex  Patient's last menstrual period was 11/06/2019.  Past medical history,surgical history, problem list, medications, allergies, family history and social history were all reviewed and documented as reviewed in the EPIC chart.   ROS:  Feeling well. No dyspnea or chest pain on exertion.  No abdominal pain, change in bowel habits, black or bloody stools. + urinary tract symptoms. GYN ROS: Times as described above   OBJECTIVE:  BP 110/80 (BP Location: Right Arm, Patient Position: Sitting, Cuff Size: Normal)   LMP 11/06/2019   Breastfeeding No  The patient appears well, alert, oriented x 3, in no distress. PELVIC EXAM: VULVA: normal appearing vulva with no masses, tenderness or lesions, VAGINA: normal appearing vagina with normal color and discharge, no lesions, CERVIX: normal appearing cervix without discharge or lesions, WET MOUNT done - results: +clue cells, +hyphae, no trichomonas, excessive WBC and bacteria, 7-12 epithelial cells/hpf Urinalysis: 10-20 WBC, no RBC, 6-10 squamous epithelial cells, moderate bacteria, no yeast, 3+ leukocyte esterase, negative nitrite, clear and yellow  Chaperone: KimAlexis Bonham present during the examination  ASSESSMENT:  34 y.o. G1P1001 here for recurrence of vaginitis symptoms due to bacterial vaginosis and candidiasis  PLAN:  We had a fairly long discussion today regarding her symptoms and test result findings over successive visits at this point after reviewing her chart in detail.  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 To treat the acute yeast infection, we will try terconazole 0.8% cream again for 4 days as the seem to work well for her last time.  I did offer her the 7-day course but  she preferred the shorter duration. To treat the acute BV, we will use a different therapy this time with 2% vaginal Cleocin cream (she is going on a bachelorette party this weekend and we discussed the avoidance of alcohol use when using any metronidazole products).  She is okay  with the idea of using 2 separate vaginal creams, I suggested using the terconazole in the morning and the Cleocin cream at night. For suppression given the recurrent problem, I discussed condom use with intercourse and the need to acidify the vaginal environment.  I will have staff send in a prescription for vaginal boric acid capsules 600 mg to use nightly for 2 weeks (when she is done using the vaginal creams) and then we will taper off use that twice weekly for the duration of 3 months.  Also discussed vaginal probiotic suppositories that are available OTC. Low suspicion for a UTI, WBC in urine likely due to vaginal contaminant.  Would recommend awaiting culture before treating empirically with antibiotics as this would worsen the vaginitis situation. In regard to the Nexplanon influence on her vaginitis symptoms, I am not certain that this is the cause, but she seems pretty convinced that she would like to try different contraceptive option.  If that is what she ultimately desires, she can make an appointment for Nexplanon removal when she is ready.   Theresia Majors MD 12/18/19

## 2019-12-18 NOTE — Addendum Note (Signed)
Addended by: Demetria Pore A on: 12/18/2019 02:00 PM   Modules accepted: Orders

## 2019-12-18 NOTE — Telephone Encounter (Signed)
I called to discuss that we use [facility] and [facility] in [place] for Rx below. She lives in [place] and explained I am not aware of a compound pharmacy in [place]. I told her another option would be on [place] she could buy and had shipped to her home. I told patient both compound pharmacy's here also can ship to her home as well. Patient said she will check on [employer] and get back with me if any problems.

## 2019-12-18 NOTE — Telephone Encounter (Signed)
-----   Message from Theresia Majors, MD sent at 12/18/2019  1:02 PM EDT ----- Regarding: vaginal boric acid suppositories Please call in 600 mg vaginal boric acid suppositories, nightly x 2 weeks, then 2 times per week for the next 3 months

## 2019-12-18 NOTE — Telephone Encounter (Signed)
Patient scheduled for OV today at 12:00

## 2019-12-20 LAB — URINALYSIS, COMPLETE W/RFL CULTURE
Bilirubin Urine: NEGATIVE
Glucose, UA: NEGATIVE
Hgb urine dipstick: NEGATIVE
Hyaline Cast: NONE SEEN /LPF
Ketones, ur: NEGATIVE
Nitrites, Initial: NEGATIVE
Protein, ur: NEGATIVE
RBC / HPF: NONE SEEN /HPF (ref 0–2)
Specific Gravity, Urine: 1.015 (ref 1.001–1.03)
pH: 8 (ref 5.0–8.0)

## 2019-12-20 LAB — CULTURE INDICATED

## 2019-12-20 LAB — URINE CULTURE
MICRO NUMBER:: 10775965
SPECIMEN QUALITY:: ADEQUATE

## 2020-01-03 DIAGNOSIS — Z20822 Contact with and (suspected) exposure to covid-19: Secondary | ICD-10-CM | POA: Diagnosis not present

## 2020-01-03 DIAGNOSIS — R05 Cough: Secondary | ICD-10-CM | POA: Diagnosis not present

## 2020-01-03 DIAGNOSIS — H6121 Impacted cerumen, right ear: Secondary | ICD-10-CM | POA: Diagnosis not present

## 2020-01-05 ENCOUNTER — Ambulatory Visit: Payer: BC Managed Care – PPO | Admitting: Obstetrics and Gynecology

## 2020-01-09 DIAGNOSIS — C44722 Squamous cell carcinoma of skin of right lower limb, including hip: Secondary | ICD-10-CM | POA: Diagnosis not present

## 2020-01-09 DIAGNOSIS — L989 Disorder of the skin and subcutaneous tissue, unspecified: Secondary | ICD-10-CM | POA: Diagnosis not present

## 2020-01-15 ENCOUNTER — Ambulatory Visit: Payer: BC Managed Care – PPO | Admitting: Obstetrics and Gynecology

## 2020-01-15 DIAGNOSIS — F4322 Adjustment disorder with anxiety: Secondary | ICD-10-CM | POA: Diagnosis not present

## 2020-01-17 DIAGNOSIS — C44722 Squamous cell carcinoma of skin of right lower limb, including hip: Secondary | ICD-10-CM | POA: Diagnosis not present

## 2020-01-22 DIAGNOSIS — F4322 Adjustment disorder with anxiety: Secondary | ICD-10-CM | POA: Diagnosis not present

## 2020-02-05 DIAGNOSIS — B079 Viral wart, unspecified: Secondary | ICD-10-CM | POA: Diagnosis not present

## 2020-02-06 DIAGNOSIS — F4322 Adjustment disorder with anxiety: Secondary | ICD-10-CM | POA: Diagnosis not present

## 2020-02-22 DIAGNOSIS — F4322 Adjustment disorder with anxiety: Secondary | ICD-10-CM | POA: Diagnosis not present

## 2020-03-18 DIAGNOSIS — F4322 Adjustment disorder with anxiety: Secondary | ICD-10-CM | POA: Diagnosis not present

## 2020-04-01 DIAGNOSIS — F4322 Adjustment disorder with anxiety: Secondary | ICD-10-CM | POA: Diagnosis not present

## 2020-04-15 DIAGNOSIS — F4322 Adjustment disorder with anxiety: Secondary | ICD-10-CM | POA: Diagnosis not present

## 2020-05-01 DIAGNOSIS — H2513 Age-related nuclear cataract, bilateral: Secondary | ICD-10-CM | POA: Diagnosis not present

## 2020-05-07 DIAGNOSIS — J069 Acute upper respiratory infection, unspecified: Secondary | ICD-10-CM | POA: Diagnosis not present

## 2020-05-07 DIAGNOSIS — Z6834 Body mass index (BMI) 34.0-34.9, adult: Secondary | ICD-10-CM | POA: Diagnosis not present
